# Patient Record
Sex: Male | Born: 1970 | Race: White | Hispanic: No | Marital: Married | Smoking: Former smoker
Health system: Southern US, Community
[De-identification: ages and names within clinical notes are randomized; demographics above are authoritative.]

---

## 2003-07-02 ENCOUNTER — Encounter: Admission: RE | Admit: 2003-07-02 | Discharge: 2003-08-07 | Payer: Self-pay | Admitting: *Deleted

## 2006-02-17 ENCOUNTER — Ambulatory Visit: Payer: Self-pay | Admitting: Cardiology

## 2015-01-30 DIAGNOSIS — I10 Essential (primary) hypertension: Secondary | ICD-10-CM | POA: Insufficient documentation

## 2015-01-30 DIAGNOSIS — E119 Type 2 diabetes mellitus without complications: Secondary | ICD-10-CM | POA: Insufficient documentation

## 2015-10-31 DIAGNOSIS — D1720 Benign lipomatous neoplasm of skin and subcutaneous tissue of unspecified limb: Secondary | ICD-10-CM | POA: Diagnosis not present

## 2015-12-25 DIAGNOSIS — Z6831 Body mass index (BMI) 31.0-31.9, adult: Secondary | ICD-10-CM | POA: Diagnosis not present

## 2015-12-25 DIAGNOSIS — E1165 Type 2 diabetes mellitus with hyperglycemia: Secondary | ICD-10-CM | POA: Diagnosis not present

## 2015-12-25 DIAGNOSIS — I10 Essential (primary) hypertension: Secondary | ICD-10-CM | POA: Diagnosis not present

## 2015-12-25 DIAGNOSIS — D1730 Benign lipomatous neoplasm of skin and subcutaneous tissue of unspecified sites: Secondary | ICD-10-CM | POA: Diagnosis not present

## 2016-04-29 DIAGNOSIS — I10 Essential (primary) hypertension: Secondary | ICD-10-CM | POA: Diagnosis not present

## 2016-04-29 DIAGNOSIS — E1165 Type 2 diabetes mellitus with hyperglycemia: Secondary | ICD-10-CM | POA: Diagnosis not present

## 2016-04-29 DIAGNOSIS — Z6831 Body mass index (BMI) 31.0-31.9, adult: Secondary | ICD-10-CM | POA: Diagnosis not present

## 2016-04-29 DIAGNOSIS — Z0000 Encounter for general adult medical examination without abnormal findings: Secondary | ICD-10-CM | POA: Diagnosis not present

## 2016-06-26 DIAGNOSIS — I10 Essential (primary) hypertension: Secondary | ICD-10-CM | POA: Diagnosis not present

## 2016-06-26 DIAGNOSIS — Z6831 Body mass index (BMI) 31.0-31.9, adult: Secondary | ICD-10-CM | POA: Diagnosis not present

## 2016-06-26 DIAGNOSIS — E1165 Type 2 diabetes mellitus with hyperglycemia: Secondary | ICD-10-CM | POA: Diagnosis not present

## 2016-06-26 DIAGNOSIS — J019 Acute sinusitis, unspecified: Secondary | ICD-10-CM | POA: Diagnosis not present

## 2016-10-29 DIAGNOSIS — I10 Essential (primary) hypertension: Secondary | ICD-10-CM | POA: Diagnosis not present

## 2016-10-29 DIAGNOSIS — H6090 Unspecified otitis externa, unspecified ear: Secondary | ICD-10-CM | POA: Diagnosis not present

## 2016-10-29 DIAGNOSIS — Z6831 Body mass index (BMI) 31.0-31.9, adult: Secondary | ICD-10-CM | POA: Diagnosis not present

## 2016-10-29 DIAGNOSIS — Z1389 Encounter for screening for other disorder: Secondary | ICD-10-CM | POA: Diagnosis not present

## 2016-10-29 DIAGNOSIS — M79672 Pain in left foot: Secondary | ICD-10-CM | POA: Diagnosis not present

## 2016-10-29 DIAGNOSIS — E1165 Type 2 diabetes mellitus with hyperglycemia: Secondary | ICD-10-CM | POA: Diagnosis not present

## 2017-04-28 DIAGNOSIS — Z0000 Encounter for general adult medical examination without abnormal findings: Secondary | ICD-10-CM | POA: Diagnosis not present

## 2017-05-03 DIAGNOSIS — Z0000 Encounter for general adult medical examination without abnormal findings: Secondary | ICD-10-CM | POA: Diagnosis not present

## 2017-05-03 DIAGNOSIS — Z6831 Body mass index (BMI) 31.0-31.9, adult: Secondary | ICD-10-CM | POA: Diagnosis not present

## 2017-05-31 ENCOUNTER — Ambulatory Visit (INDEPENDENT_AMBULATORY_CARE_PROVIDER_SITE_OTHER): Payer: Self-pay | Admitting: Otolaryngology

## 2017-06-24 ENCOUNTER — Ambulatory Visit (INDEPENDENT_AMBULATORY_CARE_PROVIDER_SITE_OTHER): Payer: BLUE CROSS/BLUE SHIELD | Admitting: Otolaryngology

## 2017-06-24 DIAGNOSIS — H9313 Tinnitus, bilateral: Secondary | ICD-10-CM | POA: Diagnosis not present

## 2017-06-24 DIAGNOSIS — H900 Conductive hearing loss, bilateral: Secondary | ICD-10-CM

## 2017-06-25 ENCOUNTER — Other Ambulatory Visit (INDEPENDENT_AMBULATORY_CARE_PROVIDER_SITE_OTHER): Payer: Self-pay | Admitting: Otolaryngology

## 2017-06-25 DIAGNOSIS — H900 Conductive hearing loss, bilateral: Secondary | ICD-10-CM

## 2017-10-14 DIAGNOSIS — H40033 Anatomical narrow angle, bilateral: Secondary | ICD-10-CM | POA: Diagnosis not present

## 2017-10-14 DIAGNOSIS — E119 Type 2 diabetes mellitus without complications: Secondary | ICD-10-CM | POA: Diagnosis not present

## 2017-10-18 DIAGNOSIS — E119 Type 2 diabetes mellitus without complications: Secondary | ICD-10-CM | POA: Diagnosis not present

## 2017-10-18 DIAGNOSIS — Z6829 Body mass index (BMI) 29.0-29.9, adult: Secondary | ICD-10-CM | POA: Diagnosis not present

## 2017-10-18 DIAGNOSIS — I10 Essential (primary) hypertension: Secondary | ICD-10-CM | POA: Diagnosis not present

## 2018-05-06 DIAGNOSIS — I10 Essential (primary) hypertension: Secondary | ICD-10-CM | POA: Diagnosis not present

## 2018-05-06 DIAGNOSIS — Z0000 Encounter for general adult medical examination without abnormal findings: Secondary | ICD-10-CM | POA: Diagnosis not present

## 2018-05-06 DIAGNOSIS — E119 Type 2 diabetes mellitus without complications: Secondary | ICD-10-CM | POA: Diagnosis not present

## 2018-05-12 DIAGNOSIS — Z6830 Body mass index (BMI) 30.0-30.9, adult: Secondary | ICD-10-CM | POA: Diagnosis not present

## 2018-05-12 DIAGNOSIS — Z0000 Encounter for general adult medical examination without abnormal findings: Secondary | ICD-10-CM | POA: Diagnosis not present

## 2018-11-10 DIAGNOSIS — Z6831 Body mass index (BMI) 31.0-31.9, adult: Secondary | ICD-10-CM | POA: Diagnosis not present

## 2018-11-10 DIAGNOSIS — M545 Low back pain: Secondary | ICD-10-CM | POA: Diagnosis not present

## 2018-11-10 DIAGNOSIS — I10 Essential (primary) hypertension: Secondary | ICD-10-CM | POA: Diagnosis not present

## 2018-11-10 DIAGNOSIS — E119 Type 2 diabetes mellitus without complications: Secondary | ICD-10-CM | POA: Diagnosis not present

## 2019-02-15 DIAGNOSIS — M545 Low back pain: Secondary | ICD-10-CM | POA: Diagnosis not present

## 2019-02-15 DIAGNOSIS — Z6830 Body mass index (BMI) 30.0-30.9, adult: Secondary | ICD-10-CM | POA: Diagnosis not present

## 2019-02-15 DIAGNOSIS — E1165 Type 2 diabetes mellitus with hyperglycemia: Secondary | ICD-10-CM | POA: Diagnosis not present

## 2019-02-15 DIAGNOSIS — I10 Essential (primary) hypertension: Secondary | ICD-10-CM | POA: Diagnosis not present

## 2019-03-15 DIAGNOSIS — E1165 Type 2 diabetes mellitus with hyperglycemia: Secondary | ICD-10-CM | POA: Diagnosis not present

## 2019-03-15 DIAGNOSIS — I10 Essential (primary) hypertension: Secondary | ICD-10-CM | POA: Diagnosis not present

## 2019-05-19 DIAGNOSIS — Z6830 Body mass index (BMI) 30.0-30.9, adult: Secondary | ICD-10-CM | POA: Diagnosis not present

## 2019-05-19 DIAGNOSIS — I10 Essential (primary) hypertension: Secondary | ICD-10-CM | POA: Diagnosis not present

## 2019-05-19 DIAGNOSIS — Z0000 Encounter for general adult medical examination without abnormal findings: Secondary | ICD-10-CM | POA: Diagnosis not present

## 2019-05-19 DIAGNOSIS — E1165 Type 2 diabetes mellitus with hyperglycemia: Secondary | ICD-10-CM | POA: Diagnosis not present

## 2019-06-16 DIAGNOSIS — Z6831 Body mass index (BMI) 31.0-31.9, adult: Secondary | ICD-10-CM | POA: Diagnosis not present

## 2019-06-16 DIAGNOSIS — Z1211 Encounter for screening for malignant neoplasm of colon: Secondary | ICD-10-CM | POA: Diagnosis not present

## 2019-06-16 DIAGNOSIS — Z1212 Encounter for screening for malignant neoplasm of rectum: Secondary | ICD-10-CM | POA: Diagnosis not present

## 2019-07-11 DIAGNOSIS — U071 COVID-19: Secondary | ICD-10-CM | POA: Diagnosis not present

## 2019-11-14 DIAGNOSIS — Z23 Encounter for immunization: Secondary | ICD-10-CM | POA: Diagnosis not present

## 2019-11-22 DIAGNOSIS — Z20828 Contact with and (suspected) exposure to other viral communicable diseases: Secondary | ICD-10-CM | POA: Diagnosis not present

## 2019-11-25 DIAGNOSIS — R509 Fever, unspecified: Secondary | ICD-10-CM | POA: Diagnosis not present

## 2019-11-25 DIAGNOSIS — R05 Cough: Secondary | ICD-10-CM | POA: Diagnosis not present

## 2019-11-25 DIAGNOSIS — R0602 Shortness of breath: Secondary | ICD-10-CM | POA: Diagnosis not present

## 2019-11-25 DIAGNOSIS — U071 COVID-19: Secondary | ICD-10-CM | POA: Diagnosis not present

## 2020-01-18 ENCOUNTER — Encounter: Payer: Self-pay | Admitting: Orthopaedic Surgery

## 2020-01-18 DIAGNOSIS — M25852 Other specified joint disorders, left hip: Secondary | ICD-10-CM | POA: Diagnosis not present

## 2020-01-18 DIAGNOSIS — Z6829 Body mass index (BMI) 29.0-29.9, adult: Secondary | ICD-10-CM | POA: Diagnosis not present

## 2020-01-18 DIAGNOSIS — I10 Essential (primary) hypertension: Secondary | ICD-10-CM | POA: Diagnosis not present

## 2020-01-18 DIAGNOSIS — M25552 Pain in left hip: Secondary | ICD-10-CM | POA: Diagnosis not present

## 2020-01-18 DIAGNOSIS — E1165 Type 2 diabetes mellitus with hyperglycemia: Secondary | ICD-10-CM | POA: Diagnosis not present

## 2020-01-18 DIAGNOSIS — M545 Low back pain: Secondary | ICD-10-CM | POA: Diagnosis not present

## 2020-01-26 DIAGNOSIS — E1165 Type 2 diabetes mellitus with hyperglycemia: Secondary | ICD-10-CM | POA: Diagnosis not present

## 2020-01-26 DIAGNOSIS — M47817 Spondylosis without myelopathy or radiculopathy, lumbosacral region: Secondary | ICD-10-CM | POA: Diagnosis not present

## 2020-01-26 DIAGNOSIS — Z1331 Encounter for screening for depression: Secondary | ICD-10-CM | POA: Diagnosis not present

## 2020-01-26 DIAGNOSIS — I10 Essential (primary) hypertension: Secondary | ICD-10-CM | POA: Diagnosis not present

## 2020-01-26 DIAGNOSIS — M25551 Pain in right hip: Secondary | ICD-10-CM | POA: Diagnosis not present

## 2020-01-31 ENCOUNTER — Encounter: Payer: Self-pay | Admitting: Orthopaedic Surgery

## 2020-01-31 ENCOUNTER — Other Ambulatory Visit: Payer: Self-pay

## 2020-01-31 ENCOUNTER — Ambulatory Visit (INDEPENDENT_AMBULATORY_CARE_PROVIDER_SITE_OTHER): Payer: BC Managed Care – PPO | Admitting: Orthopaedic Surgery

## 2020-01-31 VITALS — BP 140/92 | HR 78 | Ht 72.0 in | Wt 233.1 lb

## 2020-01-31 DIAGNOSIS — G8929 Other chronic pain: Secondary | ICD-10-CM

## 2020-01-31 DIAGNOSIS — M545 Low back pain: Secondary | ICD-10-CM

## 2020-01-31 MED ORDER — CYCLOBENZAPRINE HCL 10 MG PO TABS: 10.0000 mg | ORAL_TABLET | Freq: Every day | ORAL | 0 refills | Status: DC

## 2020-01-31 NOTE — Progress Notes (Signed)
Subjective:    Patient ID: Curtis Tran, male    DOB: 30-Jan-1971, 49 y.o.   MRN: [id number]  HPI He has had lower back pain and hip pain since July 2nd.  He has had x-rays done of the lumbar spine and hips.  He has been seen at [facility].  He has been on Naprosyn and Flexeril which help.  He has not returned to work secondary to the pain.  I have reviewed his x-rays and notes.    He has more pain in the mid lower back.  He has no radiation most of the time but some times has hip pain bilaterally but no further radiation.  He has been wearing a back brace.  He is not getting better.  He had Covid 19 in December.  He has not had vaccine yet.  I have advised him to get it.  I will set up PT for his lower back.  He may need MRI of the lumbar spine depending on how he does with PT.   Review of Systems  Constitutional: Positive for activity change.  Musculoskeletal: Positive for back pain.  All other systems reviewed and are negative.  For Review of Systems, all other systems reviewed and are negative.  The following is a summary of the past history medically, past history surgically, known current medicines, social history and family history.  This information is gathered electronically by the computer from prior information and documentation.  I review this each visit and have found including this information at this point in the chart is beneficial and informative.   History reviewed. No pertinent past medical history.  History reviewed. No pertinent surgical history.  No current outpatient medications on file prior to visit.   No current facility-administered medications on file prior to visit.    Social History   Socioeconomic History  . Marital status: Married    Spouse name: Not on file  . Number of children: Not on file  . Years of education: Not on file  . Highest education level: Not on file  Occupational History  . Not on file  Tobacco Use  . Smoking status: Not on  file  Substance and Sexual Activity  . Alcohol use: Not on file  . Drug use: Not on file  . Sexual activity: Not on file  Other Topics Concern  . Not on file  Social History Narrative  . Not on file   Social Determinants of Health   Financial Resource Strain:   . Difficulty of Paying Living Expenses:   Food Insecurity:   . Worried About Programme researcher, broadcasting/film/video in the Last Year:   . Barista in the Last Year:   Transportation Needs:   . Freight forwarder (Medical):   Marland Kitchen Lack of Transportation (Non-Medical):   Physical Activity:   . Days of Exercise per Week:   . Minutes of Exercise per Session:   Stress:   . Feeling of Stress :   Social Connections:   . Frequency of Communication with Friends and Family:   . Frequency of Social Gatherings with Friends and Family:   . Attends Religious Services:   . Active Member of Clubs or Organizations:   . Attends Banker Meetings:   Marland Kitchen Marital Status:   Intimate Partner Violence:   . Fear of Current or Ex-Partner:   . Emotionally Abused:   Marland Kitchen Physically Abused:   . Sexually Abused:     History reviewed.  No pertinent family history.  BP (!) 140/92   Pulse 78   Ht 6' (1.829 m)   Wt 233 lb 2 oz (105.7 kg)   BMI 31.62 kg/m   Body mass index is 31.62 kg/m.     Objective:   Physical Exam Vitals and nursing note reviewed.  Constitutional:      Appearance: He is well-developed.  HENT:     Head: Normocephalic and atraumatic.  Eyes:     Conjunctiva/sclera: Conjunctivae normal.     Pupils: Pupils are equal, round, and reactive to light.  Cardiovascular:     Rate and Rhythm: Normal rate and regular rhythm.  Pulmonary:     Effort: Pulmonary effort is normal.  Abdominal:     Palpations: Abdomen is soft.  Musculoskeletal:       Arms:     Cervical back: Normal range of motion and neck supple.  Skin:    General: Skin is warm and dry.  Neurological:     Mental Status: He is alert and oriented to person,  place, and time.     Cranial Nerves: No cranial nerve deficit.     Motor: No abnormal muscle tone.     Coordination: Coordination normal.     Deep Tendon Reflexes: Reflexes are normal and symmetric. Reflexes normal.  Psychiatric:        Behavior: Behavior normal.        Thought Content: Thought content normal.        Judgment: Judgment normal.           Assessment & Plan:   Encounter Diagnosis  Name Primary?  . Chronic bilateral low back pain without sciatica Yes   I will renew Flexeril.  Continue Naprosyn.  Begin PT.  Return in two weeks.  Stay out of work.  Consider MRI if not improved.  Call if any problem.  Precautions discussed.   Electronically Signed Darreld Mclean, MD 7/21/202110:19 AM

## 2020-01-31 NOTE — Patient Instructions (Signed)
Out of work 

## 2020-02-05 ENCOUNTER — Telehealth: Payer: Self-pay

## 2020-02-05 DIAGNOSIS — M545 Low back pain: Secondary | ICD-10-CM | POA: Diagnosis not present

## 2020-02-05 NOTE — Telephone Encounter (Signed)
Curtis Tran from the [place] office sent community e-mail to me this morning saying that patient says he is having reactions to the Naprosyn. I checked in his chart and he had been on Naprosyn and Flexeril already. Dr. Hilda Lias renewed his Flexeril. I called the patient and left a message for him to call me so we can discuss this.

## 2020-02-06 NOTE — Telephone Encounter (Signed)
Spoke with patient letting him know that I had discussed his reaction issue with Dr. Hilda Lias and was advised to tell him to stop the Flexeril. Patient stated that he was put on some new medication for his diabetes and maybe it is causing the reactions. I advised him to talk with his PCP and let her know what medications he is taking and see what she suggests. I again told him that Dr. Hilda Lias said to stop the Flexeril, he stated he would contact his PCP.

## 2020-02-07 DIAGNOSIS — M545 Low back pain: Secondary | ICD-10-CM | POA: Diagnosis not present

## 2020-02-12 DIAGNOSIS — M545 Low back pain: Secondary | ICD-10-CM | POA: Diagnosis not present

## 2020-02-14 ENCOUNTER — Other Ambulatory Visit: Payer: Self-pay

## 2020-02-14 ENCOUNTER — Encounter: Payer: Self-pay | Admitting: Orthopaedic Surgery

## 2020-02-14 ENCOUNTER — Ambulatory Visit (INDEPENDENT_AMBULATORY_CARE_PROVIDER_SITE_OTHER): Payer: BC Managed Care – PPO | Admitting: Orthopaedic Surgery

## 2020-02-14 VITALS — BP 141/90 | HR 78 | Ht 72.0 in | Wt 227.5 lb

## 2020-02-14 DIAGNOSIS — G8929 Other chronic pain: Secondary | ICD-10-CM | POA: Diagnosis not present

## 2020-02-14 DIAGNOSIS — M545 Low back pain, unspecified: Secondary | ICD-10-CM

## 2020-02-14 NOTE — Progress Notes (Signed)
Patient Curtis Tran, male DOB:1971/05/03, 49 y.o. DUK:[id number]  Chief Complaint  Patient presents with  . Back Pain    doing better not as much pain/hips still ache at tims    HPI  Curtis Tran is a 49 y.o. male who has lower back pain and some hip pain.  He has been taking his Naprosyn and Flexeril.  He has been going to PT.  I have reviewed the notes.  He is a little better but still has pain.  I want him to continue the PT, stay out of work.  I will hold off on getting MRI as he is a little better.   Body mass index is 30.85 kg/m.  ROS  Review of Systems  Constitutional: Positive for activity change.  Musculoskeletal: Positive for back pain.  All other systems reviewed and are negative.   All other systems reviewed and are negative.  The following is a summary of the past history medically, past history surgically, known current medicines, social history and family history.  This information is gathered electronically by the computer from prior information and documentation.  I review this each visit and have found including this information at this point in the chart is beneficial and informative.    History reviewed. No pertinent past medical history.  History reviewed. No pertinent surgical history.  History reviewed. No pertinent family history.  Social History Social History   Tobacco Use  . Smoking status: Former Smoker    Types: Cigarettes    Quit date: 02/14/2003    Years since quitting: 17.0  . Smokeless tobacco: Never Used  Substance Use Topics  . Alcohol use: Not on file  . Drug use: Not on file    Not on File  Current Outpatient Medications  Medication Sig Dispense Refill  . cyclobenzaprine (FLEXERIL) 10 MG tablet Take 1 tablet (10 mg total) by mouth at bedtime. One tablet every night at bedtime as needed for spasm. 30 tablet 0   No current facility-administered medications for this visit.     Physical Exam  Blood pressure (!) 141/90,  pulse 78, height 6' (1.829 m), weight 227 lb 8 oz (103.2 kg).  Constitutional: overall normal hygiene, normal nutrition, well developed, normal grooming, normal body habitus. Assistive device:none  Musculoskeletal: gait and station Limp none, muscle tone and strength are normal, no tremors or atrophy is present.  .  Neurological: coordination overall normal.  Deep tendon reflex/nerve stretch intact.  Sensation normal.  Cranial nerves II-XII intact.   Skin:   Normal overall no scars, lesions, ulcers or rashes. No psoriasis.  Psychiatric: Alert and oriented x 3.  Recent memory intact, remote memory unclear.  Normal mood and affect. Well groomed.  Good eye contact.  Cardiovascular: overall no swelling, no varicosities, no edema bilaterally, normal temperatures of the legs and arms, no clubbing, cyanosis and good capillary refill.  Lymphatic: palpation is normal.  Spine/Pelvis examination:  Inspection:  Overall, sacoiliac joint benign and hips nontender; without crepitus or defects.   Thoracic spine inspection: Alignment normal without kyphosis present   Lumbar spine inspection:  Alignment  with normal lumbar lordosis, without scoliosis apparent.   Thoracic spine palpation:  without tenderness of spinal processes   Lumbar spine palpation: without tenderness of lumbar area; without tightness of lumbar muscles    Range of Motion:   Lumbar flexion, forward flexion is normal without pain or tenderness    Lumbar extension is full without pain or tenderness   Left lateral bend is  normal without pain or tenderness   Right lateral bend is normal without pain or tenderness   Straight leg raising is normal  Strength & tone: normal   Stability overall normal stability  All other systems reviewed and are negative   The patient has been educated about the nature of the problem(s) and counseled on treatment options.  The patient appeared to understand what I have discussed and is in agreement  with it.  Encounter Diagnosis  Name Primary?  . Chronic bilateral low back pain without sciatica Yes    PLAN Call if any problems.  Precautions discussed.  Continue current medications.   Return to clinic 2 weeks   Stay out of work.  Continue PT.  Electronically Signed Darreld Mclean, MD 8/4/20219:13 AM

## 2020-02-14 NOTE — Patient Instructions (Signed)
Out of Work 

## 2020-02-19 DIAGNOSIS — M545 Low back pain: Secondary | ICD-10-CM | POA: Diagnosis not present

## 2020-02-22 DIAGNOSIS — M545 Low back pain: Secondary | ICD-10-CM | POA: Diagnosis not present

## 2020-02-26 DIAGNOSIS — M545 Low back pain: Secondary | ICD-10-CM | POA: Diagnosis not present

## 2020-02-28 ENCOUNTER — Encounter: Payer: Self-pay | Admitting: Orthopaedic Surgery

## 2020-02-28 ENCOUNTER — Ambulatory Visit (INDEPENDENT_AMBULATORY_CARE_PROVIDER_SITE_OTHER): Payer: BC Managed Care – PPO | Admitting: Orthopaedic Surgery

## 2020-02-28 ENCOUNTER — Other Ambulatory Visit: Payer: Self-pay

## 2020-02-28 VITALS — BP 142/89 | HR 77 | Ht 72.0 in | Wt 228.0 lb

## 2020-02-28 DIAGNOSIS — M545 Low back pain, unspecified: Secondary | ICD-10-CM

## 2020-02-28 DIAGNOSIS — G8929 Other chronic pain: Secondary | ICD-10-CM

## 2020-02-28 NOTE — Addendum Note (Signed)
Addended by: Jodene Nam A on: 02/28/2020 04:40 PM   Modules accepted: Orders

## 2020-02-28 NOTE — Patient Instructions (Signed)
Out of work 

## 2020-02-28 NOTE — Progress Notes (Addendum)
Patient Curtis Tran, male DOB:02/13/1971, 49 y.o. ZDG:[id number]  Chief Complaint  Patient presents with  . Back Pain    still has knot     HPI  Curtis Tran is a 49 y.o. male who has persistent lower back pain. He is going to PT and is making progress but he still has pain and has not improved that much.  I will get MRI of the lumbar spine.  He will need approval from his insurance company.  I will keep him out of work.  I have reviewed the PT notes.  He is to continue his Naprosyn and Flexeril.  He is to continue exercises at home.   Body mass index is 30.92 kg/m.  ROS  Review of Systems  Constitutional: Positive for activity change.  Musculoskeletal: Positive for back pain.  All other systems reviewed and are negative.   All other systems reviewed and are negative.  The following is a summary of the past history medically, past history surgically, known current medicines, social history and family history.  This information is gathered electronically by the computer from prior information and documentation.  I review this each visit and have found including this information at this point in the chart is beneficial and informative.    History reviewed. No pertinent past medical history.  History reviewed. No pertinent surgical history.  History reviewed. No pertinent family history.  Social History Social History   Tobacco Use  . Smoking status: Former Smoker    Types: Cigarettes    Quit date: 02/14/2003    Years since quitting: 17.0  . Smokeless tobacco: Never Used  Substance Use Topics  . Alcohol use: Not on file  . Drug use: Not on file    Not on File  Current Outpatient Medications  Medication Sig Dispense Refill  . cyclobenzaprine (FLEXERIL) 10 MG tablet Take 1 tablet (10 mg total) by mouth at bedtime. One tablet every night at bedtime as needed for spasm. 30 tablet 0   No current facility-administered medications for this visit.     Physical  Exam  Blood pressure (!) 142/89, pulse 77, height 6' (1.829 m), weight 228 lb (103.4 kg).  Constitutional: overall normal hygiene, normal nutrition, well developed, normal grooming, normal body habitus. Assistive device:none  Musculoskeletal: gait and station Limp none, muscle tone and strength are normal, no tremors or atrophy is present.  .  Neurological: coordination overall normal.  Deep tendon reflex/nerve stretch intact.  Sensation normal.  Cranial nerves II-XII intact.   Skin:   Normal overall no scars, lesions, ulcers or rashes. No psoriasis.  Psychiatric: Alert and oriented x 3.  Recent memory intact, remote memory unclear.  Normal mood and affect. Well groomed.  Good eye contact.  Cardiovascular: overall no swelling, no varicosities, no edema bilaterally, normal temperatures of the legs and arms, no clubbing, cyanosis and good capillary refill.  Spine/Pelvis examination:  Inspection:  Overall, sacoiliac joint benign and hips nontender; without crepitus or defects.   Thoracic spine inspection: Alignment normal without kyphosis present   Lumbar spine inspection:  Alignment  with normal lumbar lordosis, without scoliosis apparent.   Thoracic spine palpation:  without tenderness of spinal processes   Lumbar spine palpation: without tenderness of lumbar area; without tightness of lumbar muscles    Range of Motion:   Lumbar flexion, forward flexion is normal without pain or tenderness    Lumbar extension is full without pain or tenderness   Left lateral bend is normal without pain or  tenderness   Right lateral bend is normal without pain or tenderness   Straight leg raising is normal  Strength & tone: normal   Stability overall normal stability  Lymphatic: palpation is normal.  All other systems reviewed and are negative   The patient has been educated about the nature of the problem(s) and counseled on treatment options.  The patient appeared to understand what I have  discussed and is in agreement with it.  Encounter Diagnosis  Name Primary?  . Chronic bilateral low back pain without sciatica Yes    PLAN Call if any problems.  Precautions discussed.  Continue current medications.   Return to clinic 2 weeks   Get MRI of lumbar spine.  Rule out HNP.  Continue PT.  Stay out of work.  Electronically Signed Darreld Mclean, MD 8/18/202110:27 AM  Addendum: He had MRI ordered.  X-rays of the orbits were done but I did not order them.  I did not review them as I did not order.  It was done prior to MRI per protocol.  Electronically Signed Darreld Mclean, MD 9/16/20219:50 AM

## 2020-02-29 ENCOUNTER — Ambulatory Visit: Payer: Self-pay

## 2020-02-29 ENCOUNTER — Other Ambulatory Visit: Payer: Self-pay | Admitting: Orthopaedic Surgery

## 2020-02-29 DIAGNOSIS — G8929 Other chronic pain: Secondary | ICD-10-CM

## 2020-02-29 DIAGNOSIS — M545 Low back pain, unspecified: Secondary | ICD-10-CM

## 2020-03-04 DIAGNOSIS — M545 Low back pain: Secondary | ICD-10-CM | POA: Diagnosis not present

## 2020-03-05 ENCOUNTER — Ambulatory Visit
Admission: RE | Admit: 2020-03-05 | Discharge: 2020-03-05 | Disposition: A | Discharge status: Home / Self Care | Payer: BC Managed Care – PPO | Source: Ambulatory Visit | Attending: Orthopaedic Surgery | Admitting: Orthopaedic Surgery

## 2020-03-05 ENCOUNTER — Other Ambulatory Visit: Payer: Self-pay

## 2020-03-05 DIAGNOSIS — Z01818 Encounter for other preprocedural examination: Secondary | ICD-10-CM | POA: Diagnosis not present

## 2020-03-05 DIAGNOSIS — M48061 Spinal stenosis, lumbar region without neurogenic claudication: Secondary | ICD-10-CM | POA: Diagnosis not present

## 2020-03-05 DIAGNOSIS — G8929 Other chronic pain: Secondary | ICD-10-CM

## 2020-03-05 DIAGNOSIS — Z135 Encounter for screening for eye and ear disorders: Secondary | ICD-10-CM | POA: Diagnosis not present

## 2020-03-05 IMAGING — CR DG ORBITS FOR FOREIGN BODY
2 series · 2 of 2 positions shown · non-contrast
Comparison: None.

CLINICAL DATA: Metal working/exposure; clearance prior to MRI

EXAM:
ORBITS FOR FOREIGN BODY - 2 VIEW

[w orbit pa (1 of 2)]
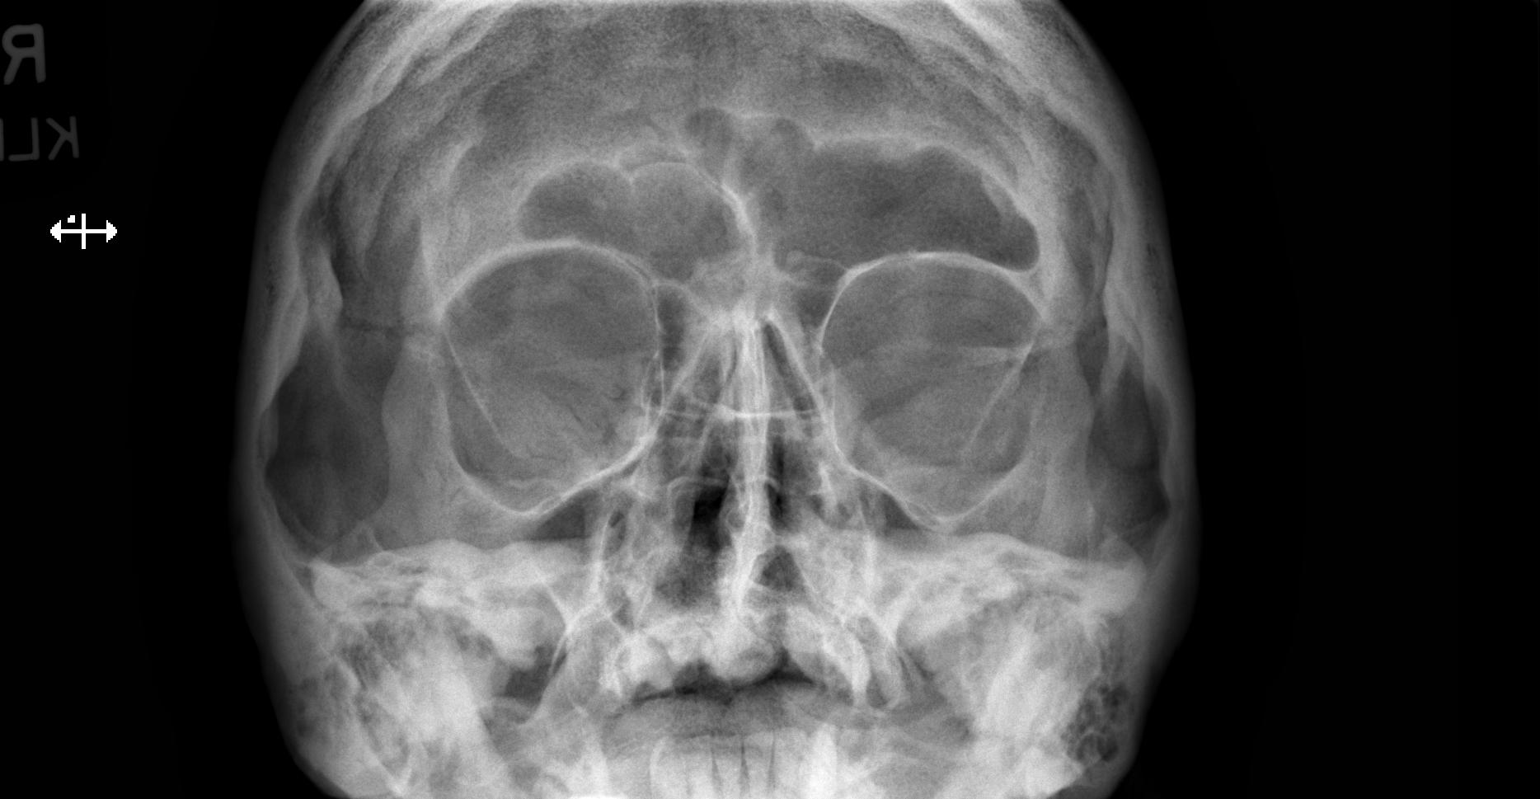

[w orbit pa (2 of 2)]
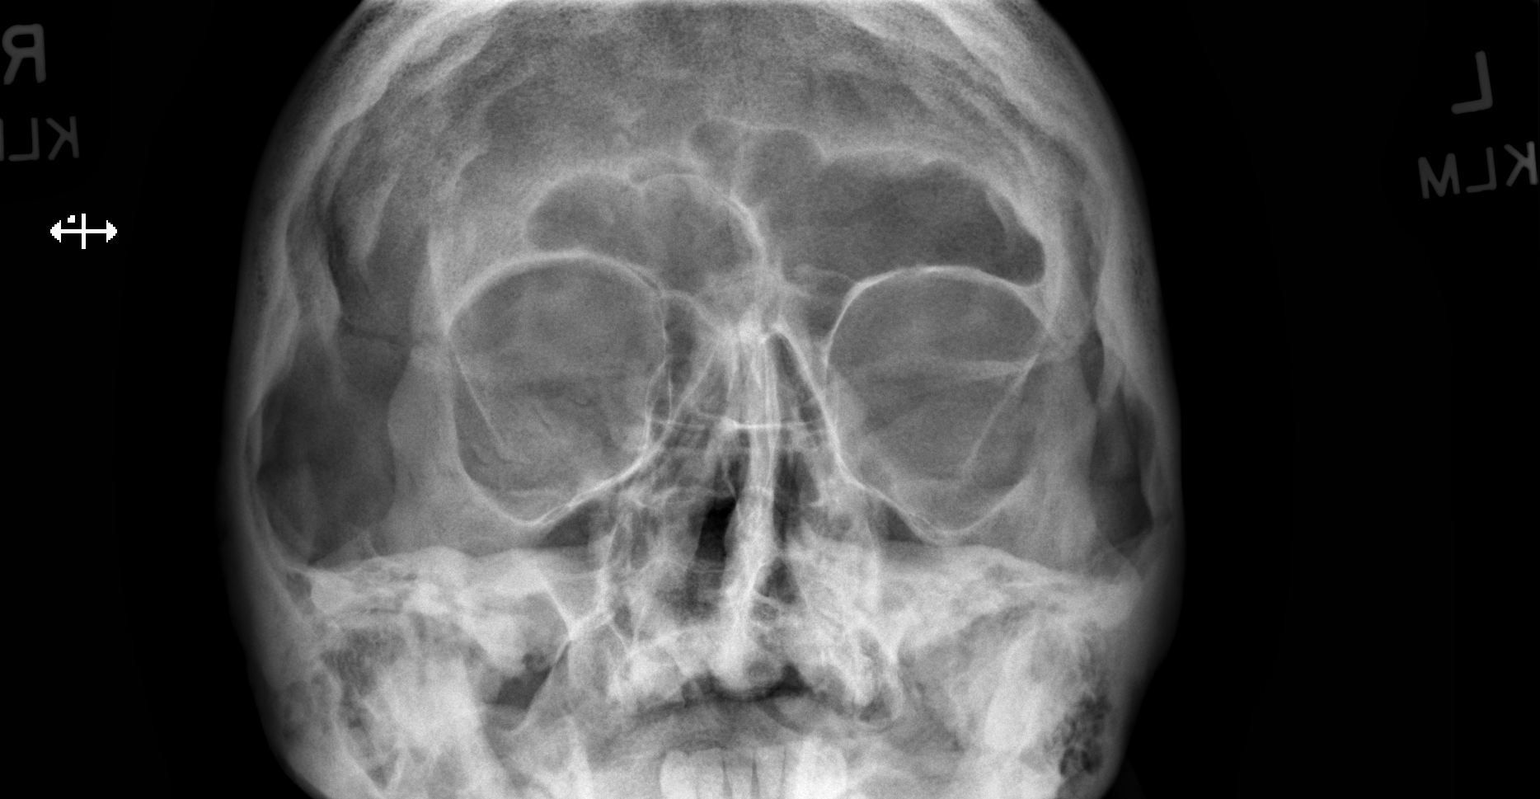

[2 of 2 positions shown; findings below may reference images not displayed]

FINDINGS: There is no evidence of metallic foreign body within the orbits. No
significant bone abnormality identified.
IMPRESSION: No evidence of metallic foreign body within the orbits.

## 2020-03-05 IMAGING — MR MR LUMBAR SPINE W/O CM
4 of 5 series · 19 of 48 positions shown · non-contrast
Comparison: None.

CLINICAL DATA: Back pain for greater 6 weeks.  Bilateral hip pain.

EXAM:
MRI LUMBAR SPINE WITHOUT CONTRAST
TECHNIQUE: Multiplanar, multisequence MR imaging of the lumbar spine was
performed. No intravenous contrast was administered.

[Series 5: T2 · sagittal · 4.0mm · 0.73mm/px · 6 of 15 slices shown (1 of 2)]
[im 1/15]
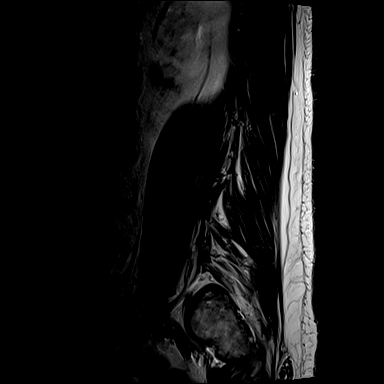
[im 3/15]
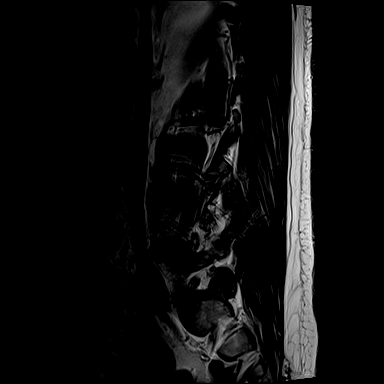
[im 6/15]
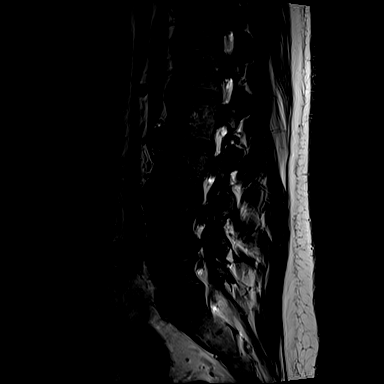
[im 9/15]
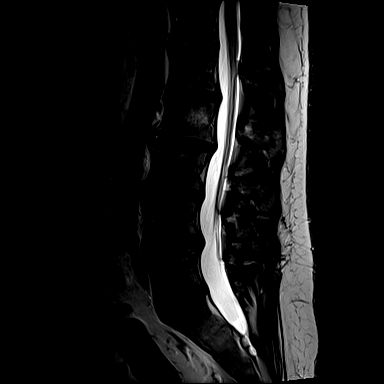
[im 12/15]
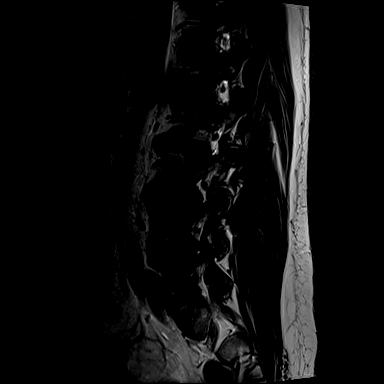
[im 15/15]
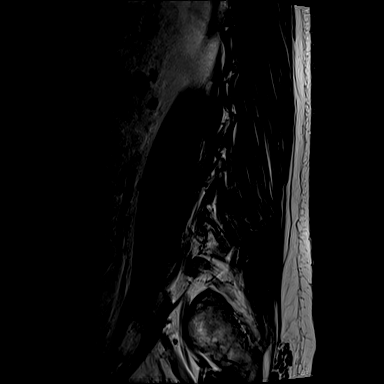

[Series 6: T1 · sagittal · 4.0mm · 0.73mm/px · 3 of 15 slices shown (1 of 2)]
[im 1/15]
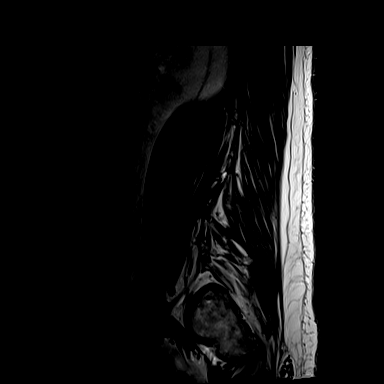
[im 8/15]
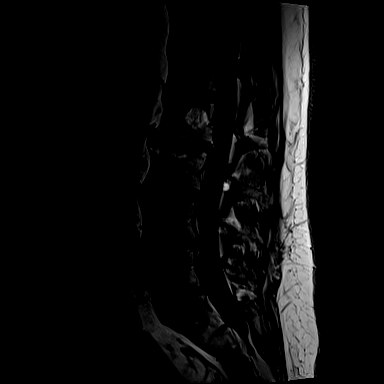
[im 15/15]
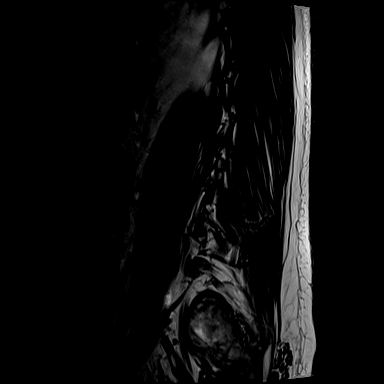

[Series 10: T1 · axial · 4.0mm · 0.28mm/px · z∈[+29,+200]mm · 3 of 43 slices shown (2 of 2)]
[im 6/43]
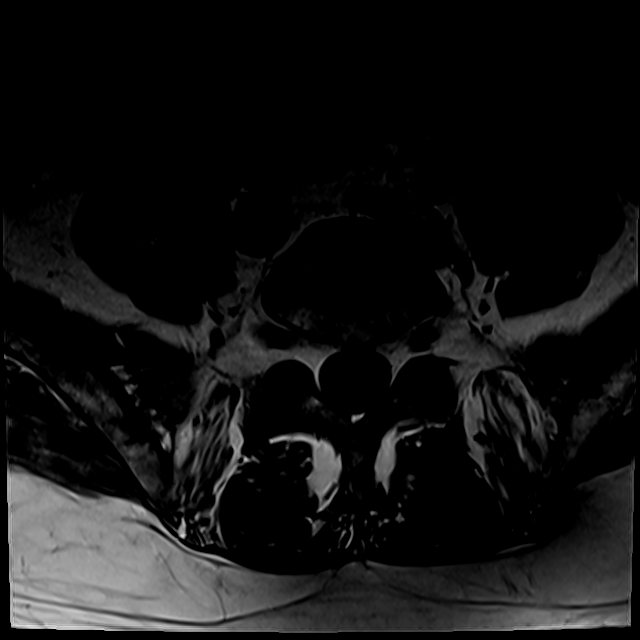
[im 23/43]
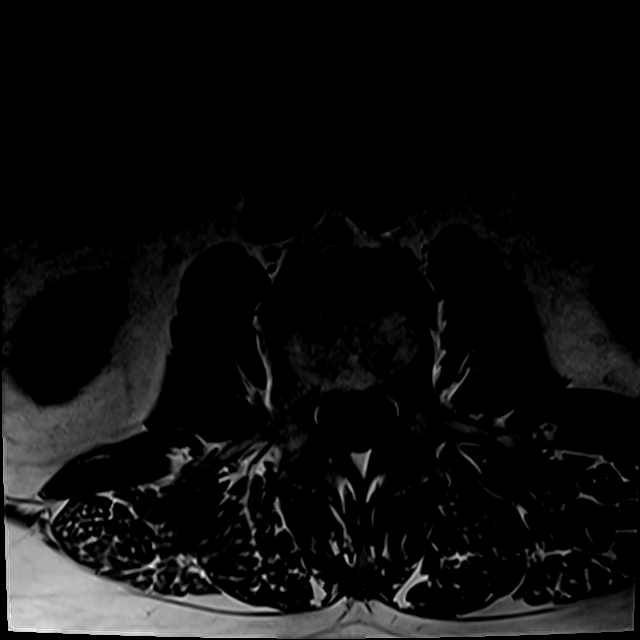
[im 37/43]
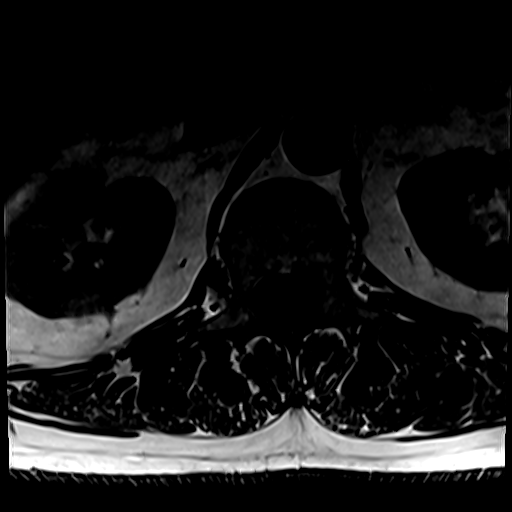

[Series 13: T2 · axial · 4.0mm · 0.28mm/px · z∈[+14,+200]mm · 7 of 43 slices shown (2 of 2)]
[im 3/43]
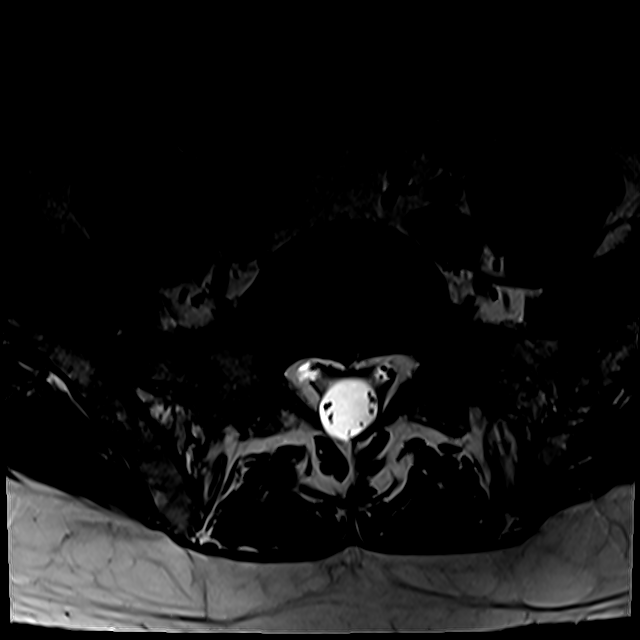
[im 6/43]
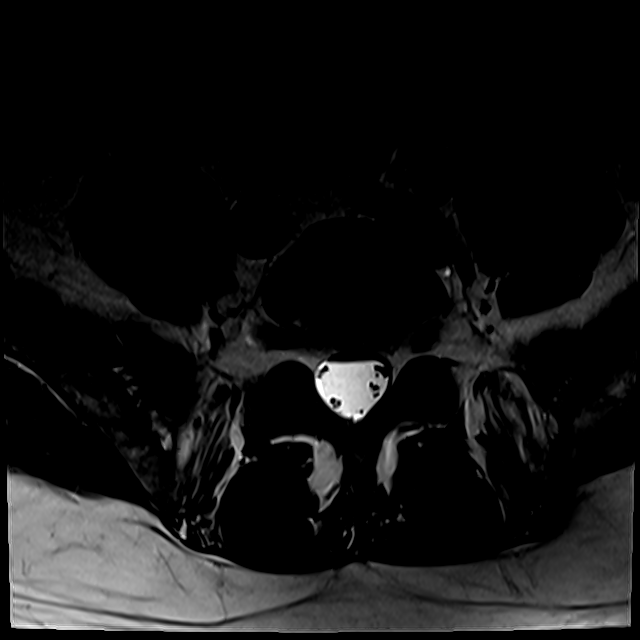
[im 9/43]
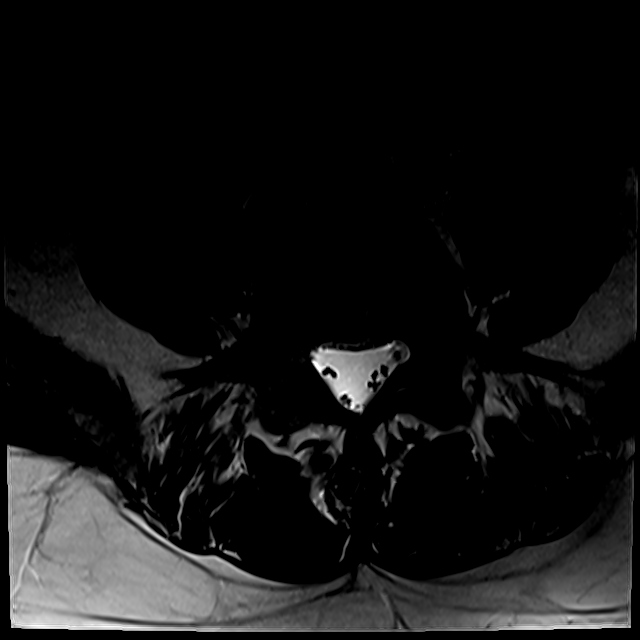
[im 15/43]
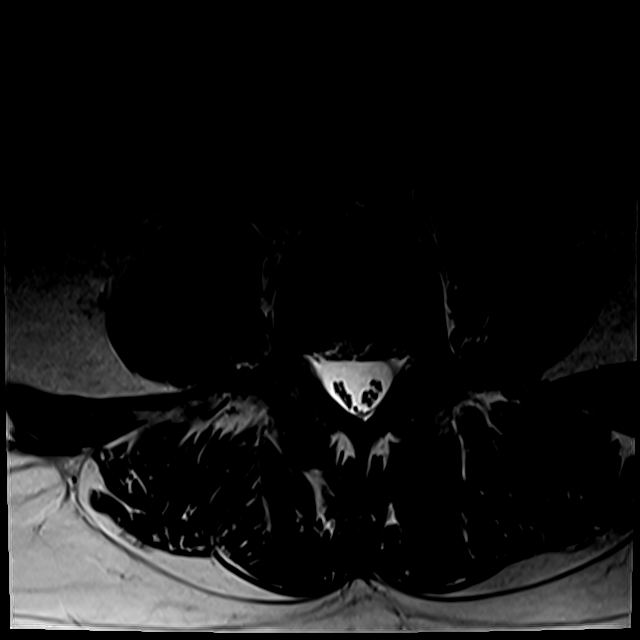
[im 20/43]
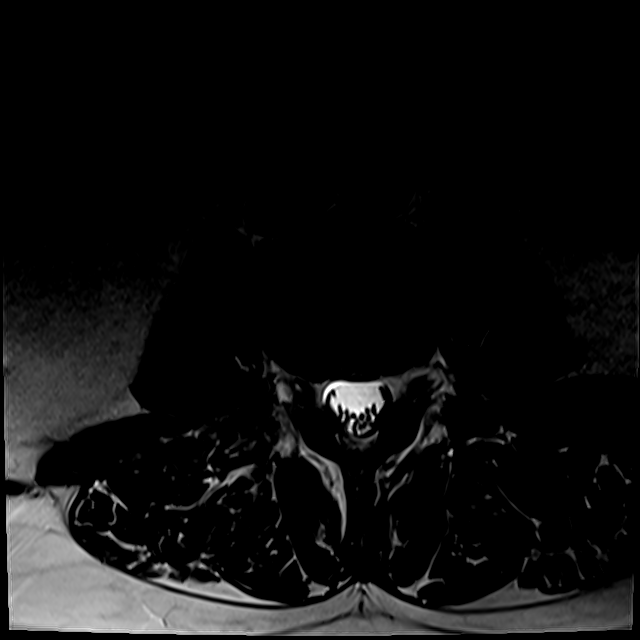
[im 23/43]
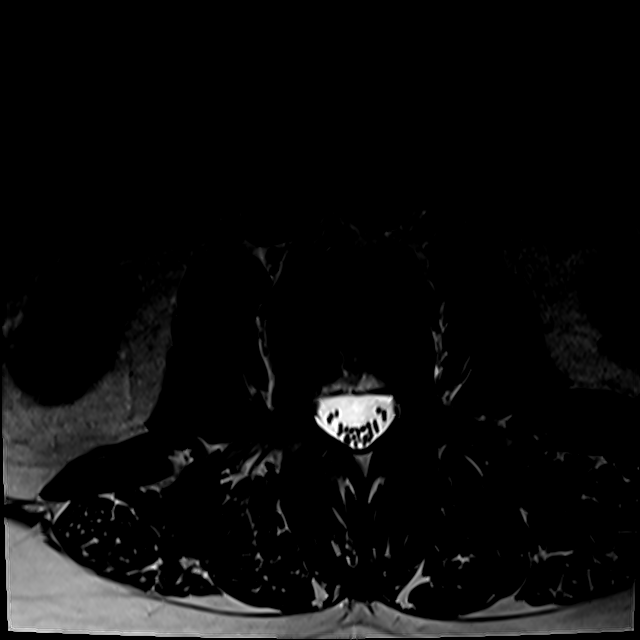
[im 37/43]
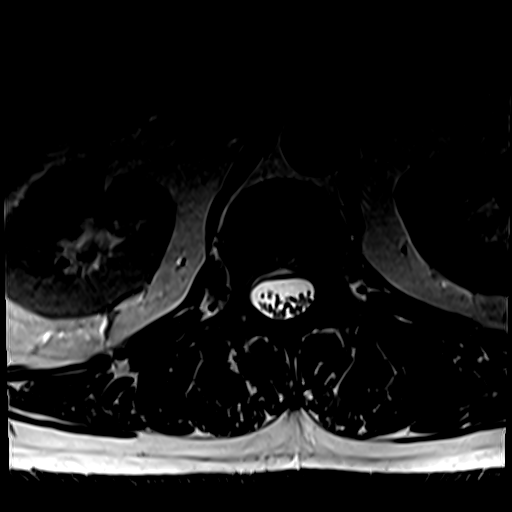

[19 of 48 positions shown; findings below may reference images not displayed]

FINDINGS: Segmentation: 5 non rib-bearing lumbar type vertebral bodies are
present. The lowest fully formed vertebral body is L5.

Alignment: Significant listhesis is present. There is straightening
of lordosis.

Vertebrae: Meningioma is present L2 posteriorly. Mild endplate
changes are noted posteriorly at L2-3. Signal and vertebral body
heights are.

Conus medullaris and cauda equina: Conus extends to the L1 level.
Conus and cauda equina appear normal.

Paraspinal and other soft tissues: Limited imaging the abdomen is
unremarkable. There is no significant adenopathy. No solid organ
lesions are present.

Disc levels:

L1-2: Negative.

L2-3: Disc bulging and facet hypertrophy is present. No significant
stenosis present.

L3-4: Mild disc bulging present. No significant stenosis is present.

L4-5: Broad-based disc protrusion present. Disc extends into the
foramina bilaterally. Mild foraminal narrowing is worse on the
right.

L5-S1: Negative.
IMPRESSION: 1. Mild foraminal narrowing bilaterally at L4-5 is worse on the
right.
2. Mild disc bulging and facet hypertrophy at L2-3 and L3-4 without
significant stenosis.

## 2020-03-06 DIAGNOSIS — M545 Low back pain: Secondary | ICD-10-CM | POA: Diagnosis not present

## 2020-03-11 DIAGNOSIS — M545 Low back pain: Secondary | ICD-10-CM | POA: Diagnosis not present

## 2020-03-13 ENCOUNTER — Encounter: Payer: Self-pay | Admitting: Orthopaedic Surgery

## 2020-03-13 ENCOUNTER — Ambulatory Visit (INDEPENDENT_AMBULATORY_CARE_PROVIDER_SITE_OTHER): Payer: BC Managed Care – PPO | Admitting: Orthopaedic Surgery

## 2020-03-13 ENCOUNTER — Other Ambulatory Visit: Payer: Self-pay

## 2020-03-13 VITALS — BP 142/91 | HR 86 | Wt 226.0 lb

## 2020-03-13 DIAGNOSIS — M545 Low back pain: Secondary | ICD-10-CM | POA: Diagnosis not present

## 2020-03-13 DIAGNOSIS — G8929 Other chronic pain: Secondary | ICD-10-CM

## 2020-03-13 NOTE — Progress Notes (Signed)
Patient Curtis Tran, male DOB:1971/05/05, 49 y.o. OAC:[id number]  No chief complaint on file.   HPI  Amando Chaput is a 49 y.o. male who has lower back pain.  He had MRI of the lumbar spine which showed: IMPRESSION: 1. Mild foraminal narrowing bilaterally at L4-5 is worse on the right. 2. Mild disc bulging and facet hypertrophy at L2-3 and L3-4 without significant stenosis.  I have independently reviewed the MRI.     I have explained the findings to him.  I will have him see Dr. Alvester Morin for epidural injections.  He is agreeable to this.   There is no height or weight on file to calculate BMI.  ROS  Review of Systems  All other systems reviewed and are negative.  The following is a summary of the past history medically, past history surgically, known current medicines, social history and family history.  This information is gathered electronically by the computer from prior information and documentation.  I review this each visit and have found including this information at this point in the chart is beneficial and informative.    History reviewed. No pertinent past medical history.  History reviewed. No pertinent surgical history.  History reviewed. No pertinent family history.  Social History Social History   Tobacco Use  . Smoking status: Former Smoker    Types: Cigarettes    Quit date: 02/14/2003    Years since quitting: 17.0  . Smokeless tobacco: Never Used  Substance Use Topics  . Alcohol use: Not on file  . Drug use: Not on file    Not on File  Current Outpatient Medications  Medication Sig Dispense Refill  . cyclobenzaprine (FLEXERIL) 10 MG tablet Take 1 tablet (10 mg total) by mouth at bedtime. One tablet every night at bedtime as needed for spasm. 30 tablet 0   No current facility-administered medications for this visit.     Physical Exam  There were no vitals taken for this visit.  Constitutional: overall normal hygiene, normal nutrition,  well developed, normal grooming, normal body habitus. Assistive device:none  Musculoskeletal: gait and station Limp none, muscle tone and strength are normal, no tremors or atrophy is present.  .  Neurological: coordination overall normal.  Deep tendon reflex/nerve stretch intact.  Sensation normal.  Cranial nerves II-XII intact.   Skin:   Normal overall no scars, lesions, ulcers or rashes. No psoriasis.  Psychiatric: Alert and oriented x 3.  Recent memory intact, remote memory unclear.  Normal mood and affect. Well groomed.  Good eye contact.  Cardiovascular: overall no swelling, no varicosities, no edema bilaterally, normal temperatures of the legs and arms, no clubbing, cyanosis and good capillary refill.  Lymphatic: palpation is normal.  Spine/Pelvis examination:  Inspection:  Overall, sacoiliac joint benign and hips nontender; without crepitus or defects.   Thoracic spine inspection: Alignment normal without kyphosis present   Lumbar spine inspection:  Alignment  with normal lumbar lordosis, without scoliosis apparent.   Thoracic spine palpation:  without tenderness of spinal processes   Lumbar spine palpation: without tenderness of lumbar area; without tightness of lumbar muscles    Range of Motion:   Lumbar flexion, forward flexion is normal without pain or tenderness    Lumbar extension is full without pain or tenderness   Left lateral bend is normal without pain or tenderness   Right lateral bend is normal without pain or tenderness   Straight leg raising is normal  Strength & tone: normal   Stability overall normal stability  All other systems reviewed and are negative   The patient has been educated about the nature of the problem(s) and counseled on treatment options.  The patient appeared to understand what I have discussed and is in agreement with it.  Encounter Diagnosis  Name Primary?  . Chronic bilateral low back pain without sciatica Yes    PLAN Call if  any problems.  Precautions discussed.  Continue current medications.   Return to clinic 6 weeks   Get Epidurals.  Return to work.  Electronically Signed Darreld Mclean, MD 9/1/20219:31 AM

## 2020-03-13 NOTE — Patient Instructions (Signed)
Return to work  

## 2020-03-20 ENCOUNTER — Telehealth: Payer: Self-pay | Admitting: Physical Medicine and Rehabilitation

## 2020-03-20 NOTE — Telephone Encounter (Signed)
Patient called. Says just set up the appointment and call with date and time.

## 2020-03-22 NOTE — Telephone Encounter (Signed)
Called pt and sch for 04/11/20 

## 2020-04-11 ENCOUNTER — Ambulatory Visit: Payer: Self-pay

## 2020-04-11 ENCOUNTER — Encounter: Payer: Self-pay | Admitting: Physical Medicine and Rehabilitation

## 2020-04-11 ENCOUNTER — Other Ambulatory Visit: Payer: Self-pay

## 2020-04-11 ENCOUNTER — Ambulatory Visit (INDEPENDENT_AMBULATORY_CARE_PROVIDER_SITE_OTHER): Payer: BC Managed Care – PPO | Admitting: Physical Medicine and Rehabilitation

## 2020-04-11 VITALS — BP 137/95 | HR 87

## 2020-04-11 DIAGNOSIS — M5416 Radiculopathy, lumbar region: Secondary | ICD-10-CM

## 2020-04-11 MED ORDER — BETAMETHASONE SOD PHOS & ACET 6 (3-3) MG/ML IJ SUSP
12.0000 mg | Freq: Once | INTRAMUSCULAR | Status: AC
  Administered 2020-04-11: 12 mg

## 2020-04-11 NOTE — Progress Notes (Signed)
Pt state lower back pain that travels to his right hip. Pt state walking and standing to long makes the pain worse. Pt state he has to lay down to ease the pain.  Numeric Pain Rating Scale and Functional Assessment Average Pain 3   In the last MONTH (on 0-10 scale) has pain interfered with the following?  1. General activity like being  able to carry out your everyday physical activities such as walking, climbing stairs, carrying groceries, or moving a chair?  Rating(4)   +Driver, -BT, -Dye Allergies.

## 2020-04-11 NOTE — Progress Notes (Signed)
Curtis Tran - 49 y.o. male MRN [id number]  Date of birth: 1971-05-22  Office Visit Note: Visit Date: 04/11/2020 PCP: Lawerance Sabal, PA Referred by: Lawerance Sabal, [place]  Subjective: Chief Complaint  Patient presents with  . Lower Back - Pain  . Right Hip - Pain   HPI:  Curtis Tran is a 49 y.o. male who comes in today at the request of Dr. Darreld Mclean for planned Bilateral L4-L5 Lumbar epidural steroid injection with fluoroscopic guidance.  The patient has failed conservative care including home exercise, medications, time and activity modification.  This injection will be diagnostic and hopefully therapeutic.  Please see requesting physician notes for further details and justification.  MRI reviewed with images and spine model.  MRI reviewed in the note below.   ROS Otherwise per HPI.  Assessment & Plan: Visit Diagnoses:  1. Lumbar radiculopathy     Plan: No additional findings.   Meds & Orders:  Meds ordered this encounter  Medications  . betamethasone acetate-betamethasone sodium phosphate (CELESTONE) injection 12 mg    Orders Placed This Encounter  Procedures  . XR C-ARM NO REPORT  . Epidural Steroid injection    Follow-up: Return if symptoms worsen or fail to improve.   Procedures: No procedures performed  Lumbosacral Transforaminal Epidural Steroid Injection - Sub-Pedicular Approach with Fluoroscopic Guidance  Patient: Curtis Tran      Date of Birth: 1971/06/05 MRN: [id number] PCP: Lawerance Sabal, PA      Visit Date: 04/11/2020   Universal Protocol:    Date/Time: 04/11/2020  Consent Given By: the patient  Position: PRONE  Additional Comments: Vital signs were monitored before and after the procedure. Patient was prepped and draped in the usual sterile fashion. The correct patient, procedure, and site was verified.   Injection Procedure Details:  Procedure Site One Meds Administered:  Meds ordered this encounter  Medications  .  betamethasone acetate-betamethasone sodium phosphate (CELESTONE) injection 12 mg    Laterality: Bilateral  Location/Site:  L4-L5  Needle size: 22 G  Needle type: Spinal  Needle Placement: Transforaminal  Findings:    -Comments: Excellent flow of contrast along the nerve, nerve root and into the epidural space.  Procedure Details: After squaring off the end-plates to get a true AP view, the C-arm was positioned so that an oblique view of the foramen as noted above was visualized. The target area is just inferior to the "nose of the scotty dog" or sub pedicular. The soft tissues overlying this structure were infiltrated with 2-3 ml. of 1% Lidocaine without Epinephrine.  The spinal needle was inserted toward the target using a "trajectory" view along the fluoroscope beam.  Under AP and lateral visualization, the needle was advanced so it did not puncture dura and was located close the 6 O'Clock position of the pedical in AP tracterory. Biplanar projections were used to confirm position. Aspiration was confirmed to be negative for CSF and/or blood. A 1-2 ml. volume of Isovue-250 was injected and flow of contrast was noted at each level. Radiographs were obtained for documentation purposes.   After attaining the desired flow of contrast documented above, a 0.5 to 1.0 ml test dose of 0.25% Marcaine was injected into each respective transforaminal space.  The patient was observed for 90 seconds post injection.  After no sensory deficits were reported, and normal lower extremity motor function was noted,   the above injectate was administered so that equal amounts of the injectate were placed at each foramen (level) into the  transforaminal epidural space.   Additional Comments:  The patient tolerated the procedure well Dressing: 2 x 2 sterile gauze and Band-Aid    Post-procedure details: Patient was observed during the procedure. Post-procedure instructions were reviewed.  Patient left the  clinic in stable condition.      Clinical History: Back pain for greater 6 weeks.  Bilateral hip pain.  EXAM: MRI LUMBAR SPINE WITHOUT CONTRAST  TECHNIQUE: Multiplanar, multisequence MR imaging of the lumbar spine was performed. No intravenous contrast was administered.  COMPARISON:  None.  FINDINGS: Segmentation: 5 non rib-bearing lumbar type vertebral bodies are present. The lowest fully formed vertebral body is L5.  Alignment: Significant listhesis is present. There is straightening of lordosis.  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 Mild endplate changes are noted posteriorly at L2-3. Signal and vertebral body heights are.  Conus medullaris and cauda equina: Conus extends to the L1 level. Conus and cauda equina appear normal.  Paraspinal and other soft tissues: Limited imaging the abdomen is unremarkable. There is no significant adenopathy. No solid organ lesions are present.  Disc levels:  L1-2: Negative.  L2-3: Disc bulging and facet hypertrophy is present. No significant stenosis present.  L3-4: Mild disc bulging present. No significant stenosis is present.  L4-5: Broad-based disc protrusion present. Disc extends into the foramina bilaterally. Mild foraminal narrowing is worse on the right.  L5-S1: Negative.  IMPRESSION: 1. Mild foraminal narrowing bilaterally at L4-5 is worse on the right. 2. Mild disc bulging and facet hypertrophy at L2-3 and L3-4 without significant stenosis.   Electronically Signed   By: Marin Roberts M.D.   On: 03/05/2020 14:31     Objective:  VS:  HT:    WT:   BMI:     BP:(!) 137/95  HR:87bpm  TEMP: ( )  RESP:  Physical Exam Constitutional:      General: He is not in acute distress.    Appearance: Normal appearance. He is not ill-appearing.  HENT:     Head: Normocephalic and atraumatic.     Right Ear: External ear normal.     Left Ear: External ear normal.  Eyes:      Extraocular Movements: Extraocular movements intact.  Cardiovascular:     Rate and Rhythm: Normal rate.     Pulses: Normal pulses.  Abdominal:     General: There is no distension.     Palpations: Abdomen is soft.  Musculoskeletal:        General: No tenderness or signs of injury.     Right lower leg: No edema.     Left lower leg: No edema.     Comments: Patient has good distal strength without clonus.  Skin:    Findings: No erythema or rash.  Neurological:     General: No focal deficit present.     Mental Status: He is alert and oriented to person, place, and time.     Sensory: No sensory deficit.     Motor: No weakness or abnormal muscle tone.     Coordination: Coordination normal.  Psychiatric:        Mood and Affect: Mood normal.        Behavior: Behavior normal.      Imaging: No results found.

## 2020-04-15 NOTE — Procedures (Signed)
Lumbosacral Transforaminal Epidural Steroid Injection - Sub-Pedicular Approach with Fluoroscopic Guidance  Patient: Curtis Tran      Date of Birth: 1971-05-26 MRN: [id number] PCP: Lawerance Sabal, PA      Visit Date: 04/11/2020   Universal Protocol:    Date/Time: 04/11/2020  Consent Given By: the patient  Position: PRONE  Additional Comments: Vital signs were monitored before and after the procedure. Patient was prepped and draped in the usual sterile fashion. The correct patient, procedure, and site was verified.   Injection Procedure Details:  Procedure Site One Meds Administered:  Meds ordered this encounter  Medications  . betamethasone acetate-betamethasone sodium phosphate (CELESTONE) injection 12 mg    Laterality: Bilateral  Location/Site:  L4-L5  Needle size: 22 G  Needle type: Spinal  Needle Placement: Transforaminal  Findings:    -Comments: Excellent flow of contrast along the nerve, nerve root and into the epidural space.  Procedure Details: After squaring off the end-plates to get a true AP view, the C-arm was positioned so that an oblique view of the foramen as noted above was visualized. The target area is just inferior to the "nose of the scotty dog" or sub pedicular. The soft tissues overlying this structure were infiltrated with 2-3 ml. of 1% Lidocaine without Epinephrine.  The spinal needle was inserted toward the target using a "trajectory" view along the fluoroscope beam.  Under AP and lateral visualization, the needle was advanced so it did not puncture dura and was located close the 6 O'Clock position of the pedical in AP tracterory. Biplanar projections were used to confirm position. Aspiration was confirmed to be negative for CSF and/or blood. A 1-2 ml. volume of Isovue-250 was injected and flow of contrast was noted at each level. Radiographs were obtained for documentation purposes.   After attaining the desired flow of contrast documented  above, a 0.5 to 1.0 ml test dose of 0.25% Marcaine was injected into each respective transforaminal space.  The patient was observed for 90 seconds post injection.  After no sensory deficits were reported, and normal lower extremity motor function was noted,   the above injectate was administered so that equal amounts of the injectate were placed at each foramen (level) into the transforaminal epidural space.   Additional Comments:  The patient tolerated the procedure well Dressing: 2 x 2 sterile gauze and Band-Aid    Post-procedure details: Patient was observed during the procedure. Post-procedure instructions were reviewed.  Patient left the clinic in stable condition.

## 2020-04-22 DIAGNOSIS — M519 Unspecified thoracic, thoracolumbar and lumbosacral intervertebral disc disorder: Secondary | ICD-10-CM | POA: Insufficient documentation

## 2020-04-22 DIAGNOSIS — M1990 Unspecified osteoarthritis, unspecified site: Secondary | ICD-10-CM | POA: Insufficient documentation

## 2020-06-10 DIAGNOSIS — Z20822 Contact with and (suspected) exposure to covid-19: Secondary | ICD-10-CM | POA: Diagnosis not present

## 2021-10-13 ENCOUNTER — Telehealth: Payer: Self-pay | Admitting: Physical Medicine and Rehabilitation

## 2021-10-13 NOTE — Telephone Encounter (Signed)
Pt called and would like to sch an appt for his back pain. ? ?CB [phone]  ?

## 2021-10-14 ENCOUNTER — Telehealth: Payer: Self-pay | Admitting: Physical Medicine and Rehabilitation

## 2021-10-14 NOTE — Telephone Encounter (Signed)
Called pt to get sch with [facility] Please send to my phone if returns call.  ?

## 2021-10-22 ENCOUNTER — Encounter: Payer: Self-pay | Admitting: Physical Medicine and Rehabilitation

## 2021-10-22 ENCOUNTER — Ambulatory Visit: Payer: BC Managed Care – PPO | Admitting: Physical Medicine and Rehabilitation

## 2021-10-22 DIAGNOSIS — M5441 Lumbago with sciatica, right side: Secondary | ICD-10-CM

## 2021-10-22 DIAGNOSIS — G8929 Other chronic pain: Secondary | ICD-10-CM

## 2021-10-22 DIAGNOSIS — M4726 Other spondylosis with radiculopathy, lumbar region: Secondary | ICD-10-CM | POA: Diagnosis not present

## 2021-10-22 DIAGNOSIS — M5416 Radiculopathy, lumbar region: Secondary | ICD-10-CM

## 2021-10-22 NOTE — Progress Notes (Signed)
? ?Curtis Tran - 51 y.o. male MRN [id number]  Date of birth: [birth year] ? ?Office Visit Note: ?Visit Date: 10/22/2021 ?PCP: Lawerance Sabal, PA ?Referred by: Lawerance Sabal, [place] ? ?Subjective: ?No chief complaint on file. ? ?HPI: Curtis Tran is a 51 y.o. male who comes in today for evaluation of chronic, worsening and severe bilateral lower back pain radiating down posterior right leg to heel of foot. Patient reports pain has been ongoing for several months and is exacerbated by movement, activity and prolonged sitting. Patient describes pain as constant sore, aching and shooting sensation, currently rates as 5 out of 10. Patient reports some relief of pain with home exercise regimen, rest and use of over the counter medications as needed. Patient states he does take Tylenol and use topical pain creams. Patient reports history of formal physical therapy for chronic lower back issues in 2021 at [facility], he reports some relief of pain with these treatments. Patients lumbar MRI from 2021 exhibits broad based disc protrusion and bilateral foraminal narrowing at L4-L5. No high grade spinal canal stenosis. Patient had bilateral L4 transforaminal epidural steroid injection performed in our office on 04/11/2020 and reports significant relief of pain. Patient reports his symptoms today are different that previous issues. Patient denies focal weakness, numbness and tingling. Patient denies recent trauma or falls.  ? ?Review of Systems  ?Musculoskeletal:  Positive for back pain.  ?Neurological:  Negative for tingling, sensory change, focal weakness and weakness.  ?All other systems reviewed and are negative. Otherwise per HPI. ? ?Assessment & Plan: ?Visit Diagnoses:  ?  ICD-10-CM   ?1. Lumbar radiculopathy  M54.16 MR LUMBAR SPINE WO CONTRAST  ?  ?2. Chronic bilateral low back pain with right-sided sciatica  M54.41 MR LUMBAR SPINE WO CONTRAST  ? G89.29   ?  ?3. Other spondylosis with radiculopathy, lumbar region  M47.26  MR LUMBAR SPINE WO CONTRAST  ?  ?   ?Plan: Findings:  ?Chronic, worsening and severe bilateral lower back pain radiating down right posterior leg to heel of foot. Patient continues to have severe pain despite good conservative therapies such as formal physical therapy, home exercise regimen, rest and use of medications as needed. Patients clinical presentation and exam are consistent with S1 nerve pattern, his new symptoms do not correlate with previous lumbar MRI from 2021. We feel the next step is to place order for new lumbar MRI imaging, patient did voice concerns about anxiety and claustrophobia related to MRI. I did speak with him about pre-procedure sedation and also informed him that I am happy to send him to a facility with an open machine. Patient states he would like to go to [facility], he did decline pre-procedure sedation at this time. Patient encouraged to remain active and to continue with home exercise regimen as tolerated. I will have patent follow up after lumbar MRI is complete to review results and discuss treatment options. No red flag symptoms noted upon exam today.   ? ?Meds & Orders: No orders of the defined types were placed in this encounter. ?  ?Orders Placed This Encounter  ?Procedures  ? MR LUMBAR SPINE WO CONTRAST  ?  ?Follow-up: Return for follow up after lumbar MRI imaging is complete for review.  ? ?Procedures: ?No procedures performed  ?   ? ?Clinical History: ?EXAM: ?MRI LUMBAR SPINE WITHOUT CONTRAST ?  ?TECHNIQUE: ?Multiplanar, multisequence MR imaging of the lumbar spine was ?performed. No intravenous contrast was administered. ?  ?COMPARISON:  None. ?  ?  FINDINGS: ?Segmentation: 5 non rib-bearing lumbar type vertebral bodies are ?present. The lowest fully formed vertebral body is L5. ?  ?Alignment: Significant listhesis is present. There is straightening ?of lordosis. ?  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 Mild endplate ?changes are noted posteriorly at  L2-3. Signal and vertebral body ?heights are. ?  ?Conus medullaris and cauda equina: Conus extends to the L1 level. ?Conus and cauda equina appear normal. ?  ?Paraspinal and other soft tissues: Limited imaging the abdomen is ?unremarkable. There is no significant adenopathy. No solid organ ?lesions are present. ?  ?Disc levels: ?  ?L1-2: Negative. ?  ?L2-3: Disc bulging and facet hypertrophy is present. No significant ?stenosis present. ?  ?L3-4: Mild disc bulging present. No significant stenosis is present. ?  ?L4-5: Broad-based disc protrusion present. Disc extends into the ?foramina bilaterally. Mild foraminal narrowing is worse on the ?right. ?  ?L5-S1: Negative. ?  ?IMPRESSION: ?1. Mild foraminal narrowing bilaterally at L4-5 is worse on the ?right. ?2. Mild disc bulging and facet hypertrophy at L2-3 and L3-4 without ?significant stenosis. ?  ?  ?Electronically Signed ?  By: Marin Robertshristopher  Mattern M.D. ?  On: 03/05/2020 14:31  ? ?He reports that he quit smoking about 18 years ago. His smoking use included cigarettes. He has never used smokeless tobacco. No results for input(s): HGBA1C, LABURIC in the last 8760 hours. ? ?Objective:  VS:  HT:    WT:   BMI:     BP:   HR: bpm  TEMP: ( )  RESP:  ?Physical Exam ?Vitals and nursing note reviewed.  ?HENT:  ?   Head: Normocephalic and atraumatic.  ?   Right Ear: External ear normal.  ?   Left Ear: External ear normal.  ?   Nose: Nose normal.  ?   Mouth/Throat:  ?   Mouth: Mucous membranes are moist.  ?Eyes:  ?   Extraocular Movements: Extraocular movements intact.  ?Cardiovascular:  ?   Rate and Rhythm: Normal rate.  ?   Pulses: Normal pulses.  ?Pulmonary:  ?   Effort: Pulmonary effort is normal.  ?Abdominal:  ?   General: Abdomen is flat. There is no distension.  ?Musculoskeletal:     ?   General: Tenderness present.  ?   Cervical back: Normal range of motion.  ?   Comments: Pt rises from seated position to standing without difficulty. Good lumbar range of motion.  Strong distal strength without clonus, no pain upon palpation of greater trochanters. Dysesthesias noted to right S1 dermatome. Sensation intact bilaterally. Walks independently, gait steady.   ?Skin: ?   General: Skin is warm and dry.  ?   Capillary Refill: Capillary refill takes less than 2 seconds.  ?Neurological:  ?   General: No focal deficit present.  ?   Mental Status: He is alert and oriented to person, place, and time.  ?Psychiatric:     ?   Mood and Affect: Mood normal.     ?   Behavior: Behavior normal.  ?  ?Ortho Exam ? ?Imaging: ?No results found. ? ?Past Medical/Family/Surgical/Social History: ?Medications & Allergies reviewed per EMR, new medications updated. ?Patient Active Problem List  ? Diagnosis Date Noted  ? Osteoarthritis 04/22/2020  ? Ruptured disk 04/22/2020  ? Type 2 diabetes mellitus (HCC) 01/30/2015  ? High blood pressure 01/30/2015  ? ?History reviewed. No pertinent past medical history. ?History reviewed. No pertinent family history. ?History reviewed. No pertinent surgical history. ?Social History  ? ?Occupational  History  ? Not on file  ?Tobacco Use  ? Smoking status: Former  ?  Types: Cigarettes  ?  Quit date: 02/14/2003  ?  Years since quitting: 18.6  ? Smokeless tobacco: Never  ?Substance and Sexual Activity  ? Alcohol use: Not on file  ? Drug use: Not on file  ? Sexual activity: Not on file  ? ?

## 2021-11-01 ENCOUNTER — Ambulatory Visit
Admission: RE | Admit: 2021-11-01 | Discharge: 2021-11-01 | Disposition: A | Discharge status: Home / Self Care | Payer: BC Managed Care – PPO | Source: Ambulatory Visit | Attending: Physical Medicine and Rehabilitation | Admitting: Physical Medicine and Rehabilitation

## 2021-11-01 IMAGING — MR MR LUMBAR SPINE W/O CM
4 of 5 series · 27 of 48 positions shown · non-contrast
Comparison: [DATE]

CLINICAL DATA: Low back pain, symptoms persist with greater than 6
weeks treatment.

EXAM:
MRI LUMBAR SPINE WITHOUT CONTRAST
TECHNIQUE: Multiplanar, multisequence MR imaging of the lumbar spine was
performed. No intravenous contrast was administered.

[Series 2: T2 · sagittal · 4.0mm · 0.53mm/px · 5 of 14 slices shown (1 of 2)]
[im 1/14]
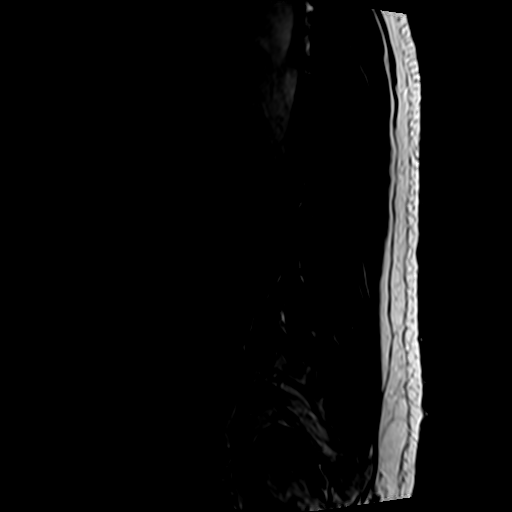
[im 4/14]
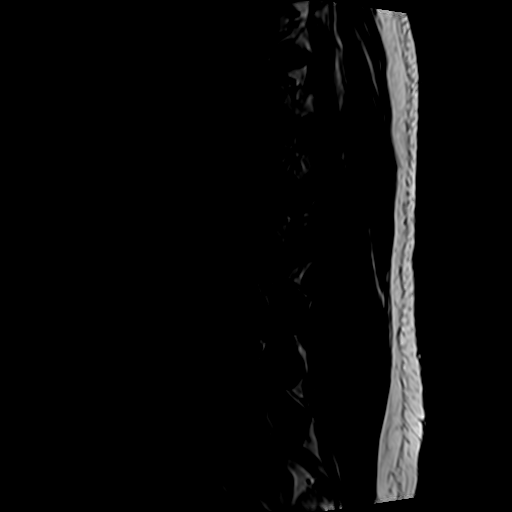
[im 7/14]
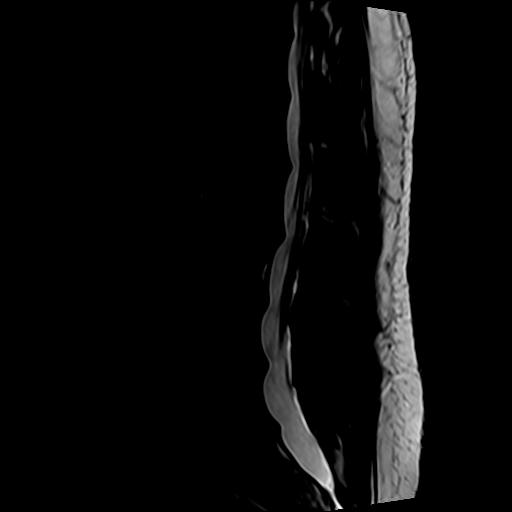
[im 10/14]
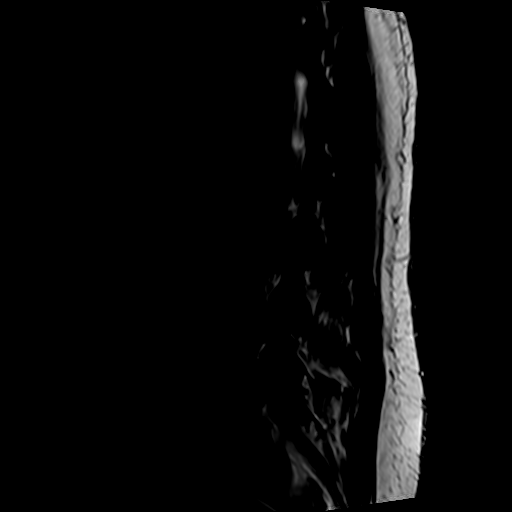
[im 14/14]
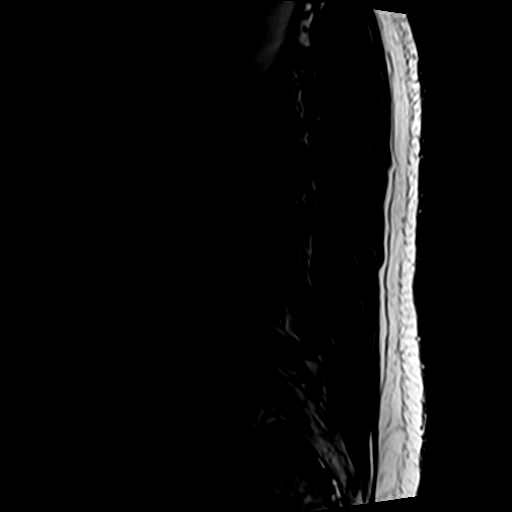

[Series 4: T1 · sagittal · 4.0mm · 0.53mm/px · 6 of 15 slices shown (1 of 2)]
[im 1/15]
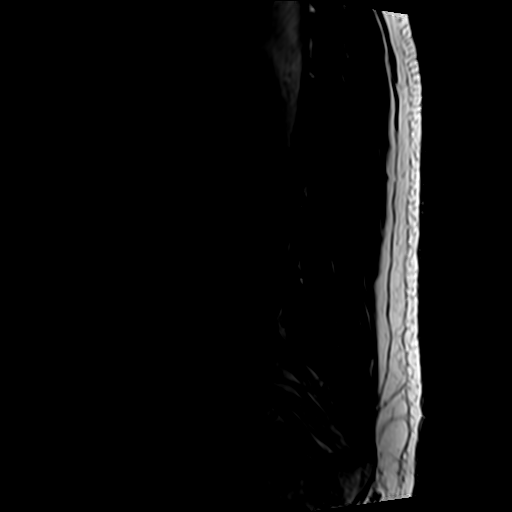
[im 3/15]
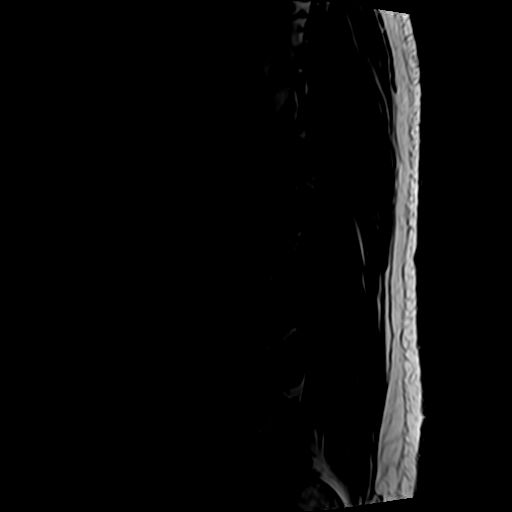
[im 6/15]
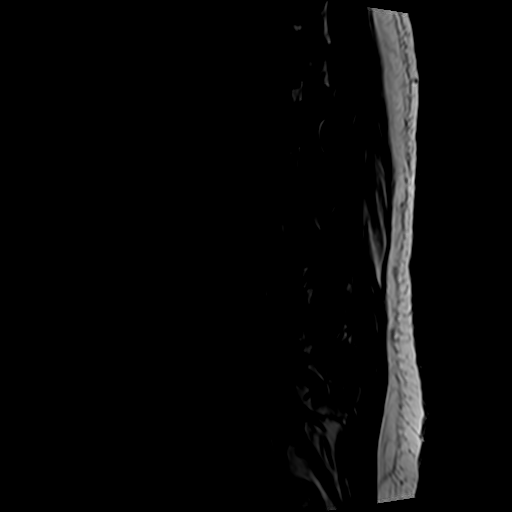
[im 9/15]
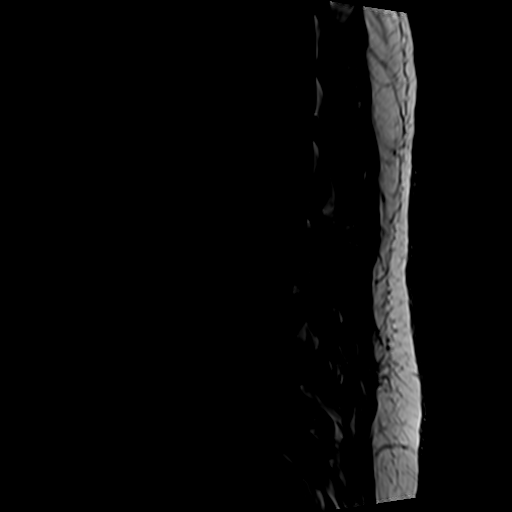
[im 12/15]
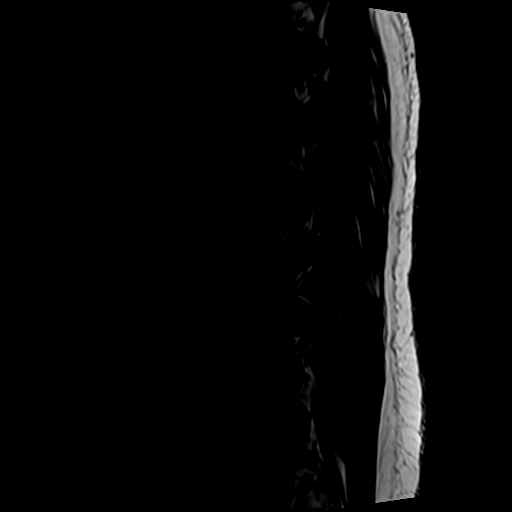
[im 15/15]
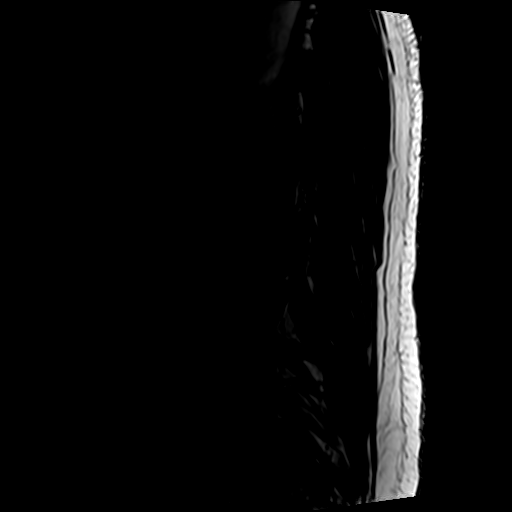

[Series 5: T2 · axial · 4.0mm · 0.70mm/px · z∈[-75,+135]mm · 10 of 40 slices shown (2 of 2)]
[im 3/40]
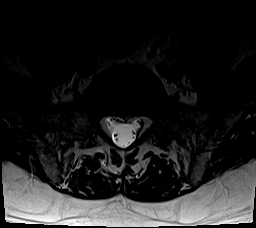
[im 6/40]
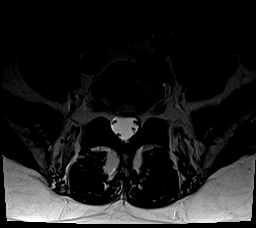
[im 8/40]
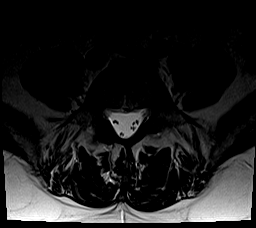
[im 14/40]
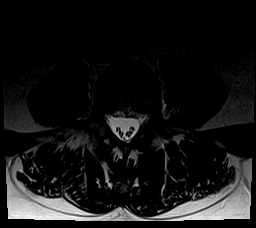
[im 19/40]
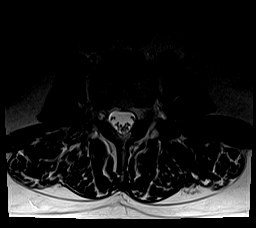
[im 21/40]
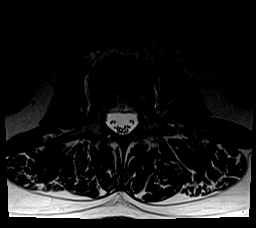
[im 24/40]
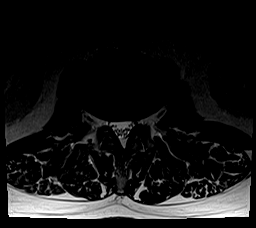
[im 29/40]
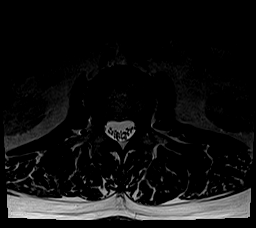
[im 34/40]
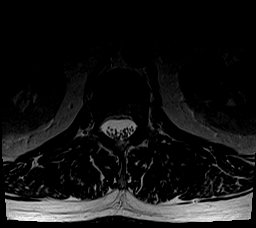
[im 40/40]
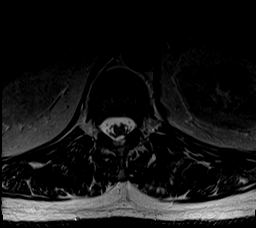

[Series 6: T1 · axial · 4.0mm · 0.35mm/px · z∈[-75,+104]mm · 6 of 40 slices shown (2 of 2)]
[im 3/40]
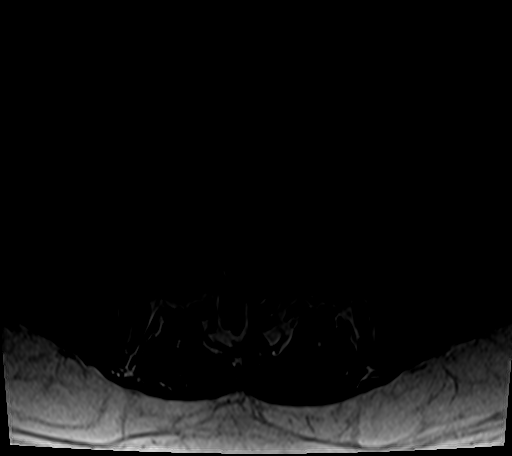
[im 6/40]
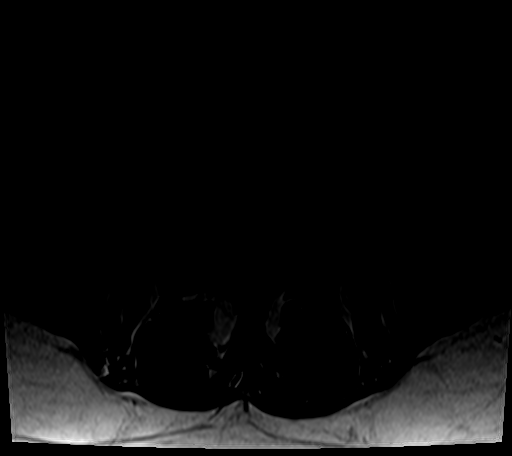
[im 8/40]
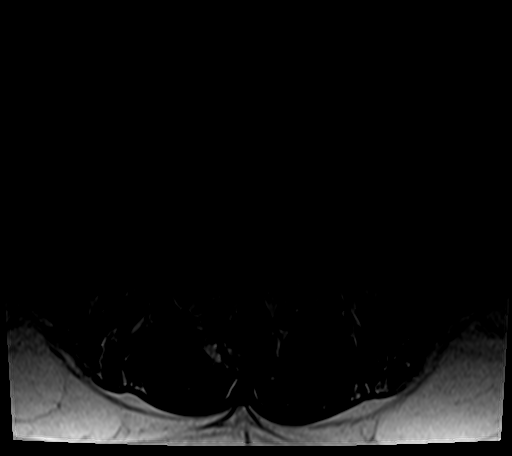
[im 14/40]
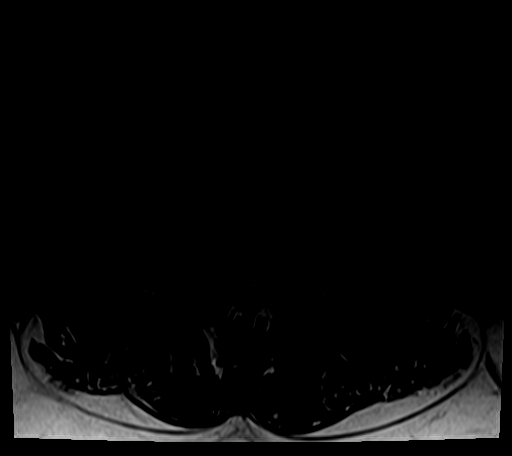
[im 21/40]
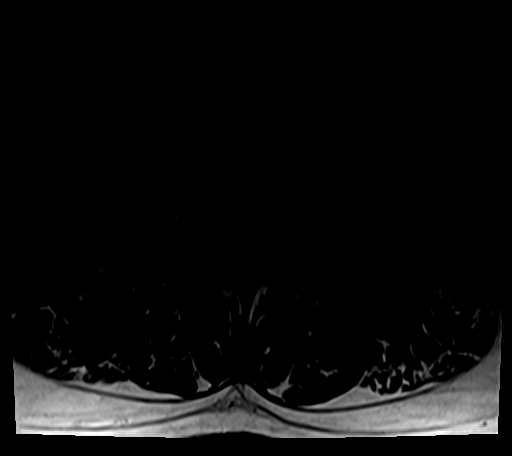
[im 34/40]
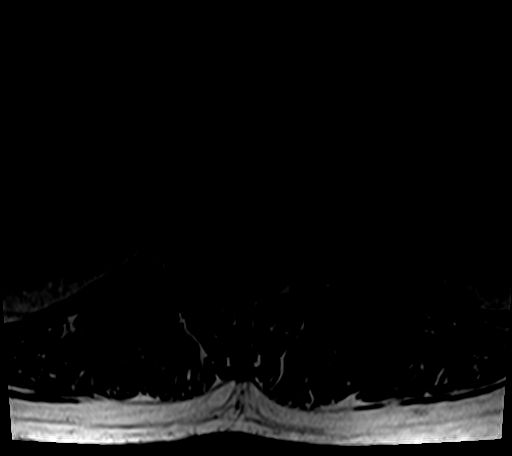

[27 of 48 positions shown; findings below may reference images not displayed]

FINDINGS: Segmentation:  Standard.

Alignment:  Physiologic.

Vertebrae:  L2 M angioma.  No acute abnormality.

Conus medullaris and cauda equina: Conus extends to the L1 level.
Conus and cauda equina appear normal.

Paraspinal and other soft tissues: Negative

Disc levels:

L1-L2: Small central disc protrusion. No spinal canal stenosis. No
neural foraminal stenosis.

L2-L3: Unchanged mild disc bulge. No spinal canal stenosis. No
neural foraminal stenosis.

L3-L4: Mild disc bulge. No spinal canal stenosis. No neural
foraminal stenosis.

L4-L5: Medium-sized disc bulge, unchanged. Mild narrowing of both
lateral recesses without central spinal canal stenosis. No neural
foraminal stenosis.

L5-S1: Normal disc space and facet joints. No spinal canal stenosis.
No neural foraminal stenosis.

Visualized sacrum: Normal.
IMPRESSION: 1. Unchanged examination with mild-to-moderate degenerative disc
disease.
2. Mild narrowing of both lateral recesses at L4-L5, unchanged.
3. No central spinal canal or neural foraminal stenosis.

## 2021-11-03 ENCOUNTER — Other Ambulatory Visit: Payer: Self-pay | Admitting: Physical Medicine and Rehabilitation

## 2021-11-03 DIAGNOSIS — M5416 Radiculopathy, lumbar region: Secondary | ICD-10-CM

## 2021-11-03 NOTE — Progress Notes (Signed)
Spoke with patient this morning, discussed recent lumbar MRI with him, he has no questions at this time. Patient would like to move forward with diagnostic lumbar epidural steroid injection. Orders placed for right L5-S1 interlaminar epidural steroid injection. No blood thinners, patient informed that we will call him to schedule.  ?

## 2021-11-05 ENCOUNTER — Telehealth: Payer: Self-pay | Admitting: Physical Medicine and Rehabilitation

## 2021-11-05 NOTE — Telephone Encounter (Signed)
MRI came in 11/01/21, let him know not much changed from prior MRI, ok to do bilat L4 tf esi and we can go over MRI at that time. ?

## 2021-12-04 ENCOUNTER — Ambulatory Visit: Payer: BC Managed Care – PPO | Admitting: Physical Medicine and Rehabilitation

## 2021-12-04 ENCOUNTER — Ambulatory Visit: Payer: Self-pay

## 2021-12-04 ENCOUNTER — Encounter: Payer: Self-pay | Admitting: Physical Medicine and Rehabilitation

## 2021-12-04 VITALS — BP 153/105 | HR 94

## 2021-12-04 DIAGNOSIS — M5416 Radiculopathy, lumbar region: Secondary | ICD-10-CM

## 2021-12-04 MED ORDER — METHYLPREDNISOLONE ACETATE 80 MG/ML IJ SUSP: 80.0000 mg | Freq: Once | INTRAMUSCULAR | Status: AC

## 2021-12-04 NOTE — Progress Notes (Signed)
Pt state lower back pain that travels down right heel and foot. Pt state any movement can make the pain worse. Pt state he takes pain meds to help ease her pain.  Numeric Pain Rating Scale and Functional Assessment Average Pain 5   In the last MONTH (on 0-10 scale) has pain interfered with the following?  1. General activity like being  able to carry out your everyday physical activities such as walking, climbing stairs, carrying groceries, or moving a chair?  Rating(10)   +Driver, -BT, -Dye Allergies.

## 2021-12-04 NOTE — Patient Instructions (Signed)

## 2021-12-10 NOTE — Procedures (Signed)
Lumbar Epidural Steroid Injection - Interlaminar Approach with Fluoroscopic Guidance  Patient: Curtis Tran      Date of Birth: 03-31-1971 MRN: [id number] PCP: Lawerance Sabal, PA      Visit Date: 12/04/2021   Universal Protocol:     Consent Given By: the patient  Position: PRONE  Additional Comments: Vital signs were monitored before and after the procedure. Patient was prepped and draped in the usual sterile fashion. The correct patient, procedure, and site was verified.   Injection Procedure Details:   Procedure diagnoses: Lumbar radiculopathy [M54.16]   Meds Administered:  Meds ordered this encounter  Medications   methylPREDNISolone acetate (DEPO-MEDROL) injection 80 mg     Laterality: Right  Location/Site:  L5-S1  Needle: 3.5 in., 20 ga. Tuohy  Needle Placement: Paramedian epidural  Findings:   -Comments: Excellent flow of contrast into the epidural space.  Procedure Details: Using a paramedian approach from the side mentioned above, the region overlying the inferior lamina was localized under fluoroscopic visualization and the soft tissues overlying this structure were infiltrated with 4 ml. of 1% Lidocaine without Epinephrine. The Tuohy needle was inserted into the epidural space using a paramedian approach.   The epidural space was localized using loss of resistance along with counter oblique bi-planar fluoroscopic views.  After negative aspirate for air, blood, and CSF, a 2 ml. volume of Isovue-250 was injected into the epidural space and the flow of contrast was observed. Radiographs were obtained for documentation purposes.    The injectate was administered into the level noted above.   Additional Comments:  The patient tolerated the procedure well Dressing: 2 x 2 sterile gauze and Band-Aid    Post-procedure details: Patient was observed during the procedure. Post-procedure instructions were reviewed.  Patient left the clinic in stable condition.

## 2021-12-10 NOTE — Progress Notes (Signed)
Reilley Latorre - 51 y.o. male MRN [id number]  Date of birth: Mar 17, 1971  Office Visit Note: Visit Date: 12/04/2021 PCP: Lawerance Sabal, PA Referred by: Lawerance Sabal, [place]  Subjective: Chief Complaint  Patient presents with   Lower Back - Pain   Right Leg - Pain   HPI:  Ruslan Mccabe is a 51 y.o. male who comes in today at the request of Ellin Goodie, FNP for planned Right L5-S1 Lumbar Interlaminar epidural steroid injection with fluoroscopic guidance.  The patient has failed conservative care including home exercise, medications, time and activity modification.  This injection will be diagnostic and hopefully therapeutic.  Please see requesting physician notes for further details and justification.  ROS Otherwise per HPI.  Assessment & Plan: Visit Diagnoses:    ICD-10-CM   1. Lumbar radiculopathy  M54.16 XR C-ARM NO REPORT    Epidural Steroid injection    methylPREDNISolone acetate (DEPO-MEDROL) injection 80 mg      Plan: No additional findings.   Meds & Orders:  Meds ordered this encounter  Medications   methylPREDNISolone acetate (DEPO-MEDROL) injection 80 mg    Orders Placed This Encounter  Procedures   XR C-ARM NO REPORT   Epidural Steroid injection    Follow-up: No follow-ups on file.   Procedures: No procedures performed  Lumbar Epidural Steroid Injection - Interlaminar Approach with Fluoroscopic Guidance  Patient: Ashwin Tibbs      Date of Birth: 09/28/1970 MRN: [id number] PCP: Lawerance Sabal, PA      Visit Date: 12/04/2021   Universal Protocol:     Consent Given By: the patient  Position: PRONE  Additional Comments: Vital signs were monitored before and after the procedure. Patient was prepped and draped in the usual sterile fashion. The correct patient, procedure, and site was verified.   Injection Procedure Details:   Procedure diagnoses: Lumbar radiculopathy [M54.16]   Meds Administered:  Meds ordered this encounter  Medications    methylPREDNISolone acetate (DEPO-MEDROL) injection 80 mg     Laterality: Right  Location/Site:  L5-S1  Needle: 3.5 in., 20 ga. Tuohy  Needle Placement: Paramedian epidural  Findings:   -Comments: Excellent flow of contrast into the epidural space.  Procedure Details: Using a paramedian approach from the side mentioned above, the region overlying the inferior lamina was localized under fluoroscopic visualization and the soft tissues overlying this structure were infiltrated with 4 ml. of 1% Lidocaine without Epinephrine. The Tuohy needle was inserted into the epidural space using a paramedian approach.   The epidural space was localized using loss of resistance along with counter oblique bi-planar fluoroscopic views.  After negative aspirate for air, blood, and CSF, a 2 ml. volume of Isovue-250 was injected into the epidural space and the flow of contrast was observed. Radiographs were obtained for documentation purposes.    The injectate was administered into the level noted above.   Additional Comments:  The patient tolerated the procedure well Dressing: 2 x 2 sterile gauze and Band-Aid    Post-procedure details: Patient was observed during the procedure. Post-procedure instructions were reviewed.  Patient left the clinic in stable condition.   Clinical History: MRI LUMBAR SPINE WITHOUT CONTRAST   TECHNIQUE: Multiplanar, multisequence MR imaging of the lumbar spine was performed. No intravenous contrast was administered.   COMPARISON:  03/05/2020   FINDINGS: Segmentation:  Standard.   Alignment:  Physiologic.   Vertebrae:  L2 M angioma.  No acute abnormality.   Conus medullaris and cauda equina: Conus extends to the L1 level.  Conus and cauda equina appear normal.   Paraspinal and other soft tissues: Negative   Disc levels:   L1-L2: Small central disc protrusion. No spinal canal stenosis. No neural foraminal stenosis.   L2-L3: Unchanged mild disc bulge. No  spinal canal stenosis. No neural foraminal stenosis.   L3-L4: Mild disc bulge. No spinal canal stenosis. No neural foraminal stenosis.   L4-L5: Medium-sized disc bulge, unchanged. Mild narrowing of both lateral recesses without central spinal canal stenosis. No neural foraminal stenosis.   L5-S1: Normal disc space and facet joints. No spinal canal stenosis. No neural foraminal stenosis.   Visualized sacrum: Normal.   IMPRESSION: 1. Unchanged examination with mild-to-moderate degenerative disc disease. 2. Mild narrowing of both lateral recesses at L4-L5, unchanged. 3. No central spinal canal or neural foraminal stenosis.     Electronically Signed   By: Deatra Robinson M.D.   On: 11/02/2021 03:21     Objective:  VS:  HT:    WT:   BMI:     BP:(!) 153/105  HR:94bpm  TEMP: ( )  RESP:  Physical Exam Vitals and nursing note reviewed.  Constitutional:      General: He is not in acute distress.    Appearance: Normal appearance. He is not ill-appearing.  HENT:     Head: Normocephalic and atraumatic.     Right Ear: External ear normal.     Left Ear: External ear normal.     Nose: No congestion.  Eyes:     Extraocular Movements: Extraocular movements intact.  Cardiovascular:     Rate and Rhythm: Normal rate.     Pulses: Normal pulses.  Pulmonary:     Effort: Pulmonary effort is normal. No respiratory distress.  Abdominal:     General: There is no distension.     Palpations: Abdomen is soft.  Musculoskeletal:        General: No tenderness or signs of injury.     Cervical back: Neck supple.     Right lower leg: No edema.     Left lower leg: No edema.     Comments: Patient has good distal strength without clonus.  Skin:    Findings: No erythema or rash.  Neurological:     General: No focal deficit present.     Mental Status: He is alert and oriented to person, place, and time.     Sensory: No sensory deficit.     Motor: No weakness or abnormal muscle tone.      Coordination: Coordination normal.  Psychiatric:        Mood and Affect: Mood normal.        Behavior: Behavior normal.     Imaging: No results found.

## 2022-09-10 ENCOUNTER — Encounter: Payer: Self-pay | Admitting: Radiology
